# Patient Record
Sex: Female | Born: 1945 | Race: White | Hispanic: No | Smoking: Never smoker
Health system: Southern US, Community
[De-identification: ages and names within clinical notes are randomized; demographics above are authoritative.]

## PROBLEM LIST (undated history)

## (undated) DIAGNOSIS — I10 Essential (primary) hypertension: Secondary | ICD-10-CM

## (undated) DIAGNOSIS — J309 Allergic rhinitis, unspecified: Secondary | ICD-10-CM

## (undated) DIAGNOSIS — E669 Obesity, unspecified: Secondary | ICD-10-CM

## (undated) HISTORY — PX: TUBAL LIGATION: SHX77

## (undated) HISTORY — PX: MOUTH SURGERY: SHX715

## (undated) HISTORY — DX: Allergic rhinitis, unspecified: J30.9

## (undated) HISTORY — PX: DILATION AND CURETTAGE OF UTERUS: SHX78

## (undated) HISTORY — DX: Obesity, unspecified: E66.9

---

## 1997-12-11 ENCOUNTER — Other Ambulatory Visit: Admission: RE | Admit: 1997-12-11 | Discharge: 1997-12-11 | Payer: Self-pay | Admitting: *Deleted

## 1998-06-18 ENCOUNTER — Encounter: Admission: RE | Admit: 1998-06-18 | Discharge: 1998-09-16 | Payer: Self-pay | Admitting: Family Medicine

## 1998-09-18 ENCOUNTER — Encounter: Admission: RE | Admit: 1998-09-18 | Discharge: 1998-12-17 | Payer: Self-pay | Admitting: Family Medicine

## 2000-01-06 ENCOUNTER — Other Ambulatory Visit: Admission: RE | Admit: 2000-01-06 | Discharge: 2000-01-06 | Payer: Self-pay | Admitting: Family Medicine

## 2001-02-13 ENCOUNTER — Encounter: Payer: Self-pay | Admitting: Vascular Surgery

## 2001-02-13 ENCOUNTER — Ambulatory Visit (HOSPITAL_COMMUNITY): Admission: RE | Admit: 2001-02-13 | Discharge: 2001-02-13 | Payer: Self-pay | Admitting: Vascular Surgery

## 2001-02-13 ENCOUNTER — Encounter (INDEPENDENT_AMBULATORY_CARE_PROVIDER_SITE_OTHER): Payer: Self-pay | Admitting: Specialist

## 2002-06-12 ENCOUNTER — Emergency Department (HOSPITAL_COMMUNITY): Admission: EM | Admit: 2002-06-12 | Discharge: 2002-06-12 | Payer: Self-pay | Admitting: *Deleted

## 2003-02-05 ENCOUNTER — Other Ambulatory Visit: Admission: RE | Admit: 2003-02-05 | Discharge: 2003-02-05 | Payer: Self-pay | Admitting: Family Medicine

## 2004-12-15 ENCOUNTER — Other Ambulatory Visit: Admission: RE | Admit: 2004-12-15 | Discharge: 2004-12-15 | Payer: Self-pay | Admitting: *Deleted

## 2006-04-29 ENCOUNTER — Emergency Department (HOSPITAL_COMMUNITY): Admission: EM | Admit: 2006-04-29 | Discharge: 2006-04-29 | Payer: Self-pay | Admitting: Emergency Medicine

## 2007-01-20 ENCOUNTER — Emergency Department (HOSPITAL_COMMUNITY): Admission: EM | Admit: 2007-01-20 | Discharge: 2007-01-20 | Payer: Self-pay | Admitting: Emergency Medicine

## 2007-10-31 ENCOUNTER — Inpatient Hospital Stay (HOSPITAL_COMMUNITY): Admission: EM | Admit: 2007-10-31 | Discharge: 2007-11-01 | Payer: Self-pay | Admitting: Emergency Medicine

## 2008-01-04 ENCOUNTER — Ambulatory Visit (HOSPITAL_COMMUNITY): Admission: RE | Admit: 2008-01-04 | Discharge: 2008-01-04 | Payer: Self-pay | Admitting: Family Medicine

## 2008-02-20 ENCOUNTER — Encounter: Admission: RE | Admit: 2008-02-20 | Discharge: 2008-02-20 | Payer: Self-pay | Admitting: Family Medicine

## 2008-03-04 ENCOUNTER — Ambulatory Visit (HOSPITAL_COMMUNITY): Admission: RE | Admit: 2008-03-04 | Discharge: 2008-03-04 | Payer: Self-pay | Admitting: Family Medicine

## 2008-05-01 ENCOUNTER — Encounter (HOSPITAL_COMMUNITY): Admission: RE | Admit: 2008-05-01 | Discharge: 2008-07-30 | Payer: Self-pay | Admitting: Family Medicine

## 2010-03-15 ENCOUNTER — Encounter: Payer: Self-pay | Admitting: Family Medicine

## 2010-07-07 NOTE — Discharge Summary (Signed)
NAMEAYN, DOMANGUE               ACCOUNT NO.:  [id number]   MEDICAL RECORD NO.:  [id number]          PATIENT TYPE:  INP   LOCATION:  3703                         FACILITY:  [facility]   PHYSICIAN:  Francisca December, M.D.  DATE OF BIRTH:  11-20-1945   DATE OF ADMISSION:  10/31/2007  DATE OF DISCHARGE:  11/01/2007                               DISCHARGE SUMMARY   DISCHARGE DIAGNOSES:  1. Chest pain, atypical.  2. Bronchitis.  3. Cough.  4. Herpes simplex virus.  5. Dyslipidemia.  6. Postmenopausal.  7. Status post tubal ligation.  8. Long-term medication use.   HOSPITAL COURSE:  Ms. Ulibarri is a 65 year old female with no known  coronary artery disease who began having substernal chest pain around  8:20 on the morning of admission.  She describes this as 2 types of  pain, first of all as a chest wall sharp pain and then another type of  pain, which she describes as a mid-to-right chest heaviness.  This is  associated with dyspnea, dizziness, and productive cough with yellow  sputum.  She says it is painful to deep breath.  She does have a  history of bronchitis with pleurisy.   Her EKG was non-acute.  There was a slight ST segment elevation in the  lateral leads which was not new.  Her lab studies show negative cardiac  active enzymes x3.  White count 4.8, hemoglobin 12.0, hematocrit 34.8,  and platelets 202.  Total cholesterol 136, triglycerides 110, HDL 43,  and LDL 71.  D-dimer is 0.43.  Sodium 140, potassium 3.7, BUN 6, and  creatinine 0.69.  LFTs normal.  Final chest x-ray on November 01, 2007,  showed no acute cardiopulmonary abnormalities.   Because of her symptoms, we thought it was prudent for her to be placed  on the antibiotic for bronchitis as listed below.  She is to follow up  with Dr. Amil Amen only as needed.  She is instructed to follow with Dr.  Abigail Miyamoto if she is not better next week.   She is to resume Zocor daily.  She states she is on an osteopenia drug  every  Sunday, she is to resume this.  Vitamins daily as before, Valtrex  as needed, Zithromax pack to take as directed, and Tessalon Perles as  needed for cough.   She is to remain on a low-sodium, heart-healthy diet; increase activity  slowly; and again call Dr. Abigail Miyamoto and to see him next week if she is  not any better.      Guy Franco, P.A.      Francisca December, M.D.  Electronically Signed    LB/MEDQ  D:  11/01/2007  T:  11/01/2007  Job:  [id number]   cc:   Chales Salmon. Abigail Miyamoto, M.D.

## 2010-07-07 NOTE — H&P (Signed)
NAMEJERI, Jennifer Curry NO.:  [id number]   MEDICAL RECORD NO.:  [id number]          PATIENT TYPE:  INP   LOCATION:  3703                         FACILITY:  [facility]   PHYSICIAN:  Jennifer Curry, M.D.  DATE OF BIRTH:  04-Jan-1946   DATE OF ADMISSION:  10/31/2007  DATE OF DISCHARGE:                              HISTORY & PHYSICAL   CHIEF COMPLAINT:  Chest pain.   Jennifer Curry is a 65 year old female with no known history of coronary  artery disease who began having substernal chest pain acutely at 8:20  this morning.  She describes it as 2 types of pain.  First of all, she  describes it as a left sharp chest wall pain in the mid-to-right chest  area.  She describes it more as a heaviness.  This is associated with  dizziness and mild dyspnea.  She also has a productive cough with yellow  sputum.  She says it is painful when she takes a deep breath.  She has a  history of bronchitis.   PAST MEDICAL HISTORY:  1. Herpes simplex virus.  2. Dyslipidemia.  3. She is postmenopausal.  She has undergone tubal ligation.   SOCIAL HISTORY:  She lives in [place] with her boyfriend, but lives part-  time in [place] where her home is.  She denies any tobacco, alcohol, or  illicit drug use.   FAMILY HISTORY:  Mom died at age 57 of old age and probable heart  attack.  Dad died at age 12 of a railroad accident.  He was a Building surveyor.   REVIEW OF SYSTEMS:  As above, otherwise, negative.   ALLERGIES:  SULFA and VICODIN.   MEDICATIONS:  Zocor daily, osteopenia drug every Sunday, vitamins daily,  Valtrex p.r.n.   PHYSICAL EXAMINATION:  VITAL SIGNS:  Pulse 75, respirations 20, blood  pressure 133/76, O2 saturation is 99% on 2 L.  GENERAL:  She appears pale and generally ill.  HEENT:  Grossly normal.  No carotid or subclavian bruits.  No JVD or  thyromegaly.  Sclerae clear.  Conjunctivae normal.  Nares without  drainage.  CHEST:  Clear to auscultation bilaterally.  No  wheezing or rhonchi.  HEART:  Regular rate and rhythm.  There is murmur.  ABDOMEN:  Good bowel sounds, nontender, nondistended.  No masses.  No  bruits.  LOWER EXTREMITIES:  No peripheral edema.  SKIN:  Warm and dry.  NEUROLOGICAL:  Cranial nerves II through XII are grossly intact.  She  does appear anxious.   Chest x-ray and lab studies are pending at this time and will be  reviewed once they have been drawn.   EKG shows normal sinus rhythm with nonspecific ST or T-wave changes  inferolaterally, seems to be unchanged from previous EKG from Dr.  Recardo Evangelist office.   ASSESSMENT AND PLAN:  1. Chest pain, atypical.  2. Dyslipidemia.  3. Osteopenia.  4. Herpes simplex virus.  5. Postmenopausal.  6. Cough.   We will admit her, check enzymes and electrolytes.  Chest x-ray do show  no underlying pneumonia.  If enzymes are positive,  we will go ahead on  cath.  If negative, she will need a Cardiolite at some point.   The patient has been seen and examined by Dr. Corliss Curry.      Jennifer Curry, P.A.      Jennifer Curry, M.D.  Electronically Signed    LB/MEDQ  D:  10/31/2007  T:  11/01/2007  Job:  [id number]   cc:   Jennifer Curry. Jennifer Curry, M.D.  Jennifer Curry, M.D.

## 2010-07-10 NOTE — Op Note (Signed)
[place]  Patient:    BILLIJO, DILLING Visit Number: [id number] MRN: [id number]          Service Type: Attending:  Quita Skye. Hart Rochester, M.D. Dictated by:   Quita Skye Hart Rochester, M.D. Proc. Date: 02/13/01                             Operative Report  PREOPERATIVE DIAGNOSIS:  Rule out temporal arteritis, left temporal headaches.  POSTOPERATIVE DIAGNOSIS:  Rule out temporal arteritis, left temporal headaches.  OPERATION:  Left temporal artery biopsy.  SURGEON:  Quita Skye. Hart Rochester, M.D.  FIRST ASSISTANT:  Nurse.  ANESTHESIA:  Local.  PROCEDURE:  The patient was taken to the operating room and placed in the supine position, at which time the left temporal area was prepped with Betadine solution and draped in a routine sterile manner.  After infiltration with 1% Xylocaine, a short longitudinal incision was made anterior to the tragus of the ear over the lying superficial temporal artery.  This was carried down through subcutaneous tissue and the superficial temporal artery was identified, dissected free, and ligated proximally and distally with 3-0 silk ties.  A 1.5 cm segment was removed and sent to the lab for pathologic evaluation.  Adequate hemostasis was achieved.  The wound was closed with Vicryl in a subcuticular fashion with a Steri-Strip, sterile dressing applied. The patient was taken to the recovery room in satisfactory condition. Dictated by:   Quita Skye Hart Rochester, M.D. Attending:  Quita Skye. Hart Rochester, M.D. DD:  02/13/01 TD:  02/13/01 Job: [id number]

## 2010-11-24 LAB — BUN: BUN: 10

## 2010-11-24 LAB — CREATININE, SERUM: GFR calc Af Amer: >60

## 2010-11-25 LAB — COMPREHENSIVE METABOLIC PANEL
ALT: 16
BUN: 6
CO2: 26
Calcium: 8.7
Creatinine, Ser: 0.69
GFR calc non Af Amer: >60
Glucose, Bld: 88

## 2010-11-25 LAB — DIFFERENTIAL
Eosinophils Absolute: 0.1
Lymphs Abs: 1.2
Neutrophils Relative %: 72

## 2010-11-25 LAB — PROTIME-INR
INR: 1.1
Prothrombin Time: 14.6

## 2010-11-25 LAB — CBC
HCT: 34.8 — ABNORMAL LOW
Hemoglobin: 12
MCHC: 34.2
MCV: 92.1
MCV: 92.6
Platelets: 199
RBC: 3.78 — ABNORMAL LOW
RDW: 13
WBC: 4.8

## 2010-11-25 LAB — CARDIAC PANEL(CRET KIN+CKTOT+MB+TROPI)
Relative Index: INVALID
Troponin I: 0.01
Troponin I: 0.01

## 2010-11-25 LAB — LIPID PANEL
Cholesterol: 136
Total CHOL/HDL Ratio: 3.2
VLDL: 22

## 2010-11-25 LAB — CK TOTAL AND CKMB (NOT AT ARMC): Relative Index: INVALID

## 2011-10-15 DIAGNOSIS — N760 Acute vaginitis: Secondary | ICD-10-CM | POA: Diagnosis not present

## 2012-06-08 DIAGNOSIS — Z1231 Encounter for screening mammogram for malignant neoplasm of breast: Secondary | ICD-10-CM | POA: Diagnosis not present

## 2013-01-26 ENCOUNTER — Emergency Department (HOSPITAL_COMMUNITY)
Admission: EM | Admit: 2013-01-26 | Discharge: 2013-01-26 | Disposition: A | Discharge status: Home / Self Care | Payer: Medicare Other | Attending: Emergency Medicine | Admitting: Emergency Medicine

## 2013-01-26 ENCOUNTER — Emergency Department (HOSPITAL_COMMUNITY): Payer: Medicare Other

## 2013-01-26 ENCOUNTER — Encounter (HOSPITAL_COMMUNITY): Payer: Self-pay | Admitting: Emergency Medicine

## 2013-01-26 DIAGNOSIS — S0993XA Unspecified injury of face, initial encounter: Secondary | ICD-10-CM | POA: Diagnosis not present

## 2013-01-26 DIAGNOSIS — S0120XA Unspecified open wound of nose, initial encounter: Secondary | ICD-10-CM | POA: Insufficient documentation

## 2013-01-26 DIAGNOSIS — S0990XA Unspecified injury of head, initial encounter: Secondary | ICD-10-CM | POA: Insufficient documentation

## 2013-01-26 DIAGNOSIS — M25511 Pain in right shoulder: Secondary | ICD-10-CM

## 2013-01-26 DIAGNOSIS — S8002XA Contusion of left knee, initial encounter: Secondary | ICD-10-CM

## 2013-01-26 DIAGNOSIS — S0003XA Contusion of scalp, initial encounter: Secondary | ICD-10-CM | POA: Insufficient documentation

## 2013-01-26 DIAGNOSIS — IMO0002 Reserved for concepts with insufficient information to code with codable children: Secondary | ICD-10-CM | POA: Insufficient documentation

## 2013-01-26 DIAGNOSIS — Z23 Encounter for immunization: Secondary | ICD-10-CM | POA: Insufficient documentation

## 2013-01-26 DIAGNOSIS — W1809XA Striking against other object with subsequent fall, initial encounter: Secondary | ICD-10-CM | POA: Insufficient documentation

## 2013-01-26 DIAGNOSIS — S8001XA Contusion of right knee, initial encounter: Secondary | ICD-10-CM

## 2013-01-26 DIAGNOSIS — Y9301 Activity, walking, marching and hiking: Secondary | ICD-10-CM | POA: Insufficient documentation

## 2013-01-26 DIAGNOSIS — Y9289 Other specified places as the place of occurrence of the external cause: Secondary | ICD-10-CM | POA: Insufficient documentation

## 2013-01-26 DIAGNOSIS — S199XXA Unspecified injury of neck, initial encounter: Secondary | ICD-10-CM | POA: Diagnosis not present

## 2013-01-26 DIAGNOSIS — S8000XA Contusion of unspecified knee, initial encounter: Secondary | ICD-10-CM | POA: Insufficient documentation

## 2013-01-26 DIAGNOSIS — W19XXXA Unspecified fall, initial encounter: Secondary | ICD-10-CM

## 2013-01-26 DIAGNOSIS — S4980XA Other specified injuries of shoulder and upper arm, unspecified arm, initial encounter: Secondary | ICD-10-CM | POA: Diagnosis not present

## 2013-01-26 DIAGNOSIS — S46909A Unspecified injury of unspecified muscle, fascia and tendon at shoulder and upper arm level, unspecified arm, initial encounter: Secondary | ICD-10-CM | POA: Insufficient documentation

## 2013-01-26 DIAGNOSIS — T148XXA Other injury of unspecified body region, initial encounter: Secondary | ICD-10-CM

## 2013-01-26 LAB — CBC
HCT: 37.9 % (ref 36.0–46.0)
Hemoglobin: 13 g/dL (ref 12.0–15.0)
MCH: 31 pg (ref 26.0–34.0)
MCHC: 34.3 g/dL (ref 30.0–36.0)

## 2013-01-26 LAB — URINALYSIS, ROUTINE W REFLEX MICROSCOPIC
Bilirubin Urine: NEGATIVE
Glucose, UA: NEGATIVE mg/dL
Hgb urine dipstick: NEGATIVE
Ketones, ur: NEGATIVE mg/dL
pH: 7 (ref 5.0–8.0)

## 2013-01-26 LAB — COMPREHENSIVE METABOLIC PANEL
Alkaline Phosphatase: 84 U/L (ref 39–117)
BUN: 13 mg/dL (ref 6–23)
CO2: 27 mEq/L (ref 19–32)
Calcium: 9.2 mg/dL (ref 8.4–10.5)
GFR calc Af Amer: >90 mL/min (ref >90)
GFR calc non Af Amer: 85 mL/min — ABNORMAL LOW (ref >90)
Glucose, Bld: 123 mg/dL — ABNORMAL HIGH (ref 70–99)
Total Protein: 7.7 g/dL (ref 6.0–8.3)

## 2013-01-26 LAB — URINE MICROSCOPIC-ADD ON

## 2013-01-26 MED ORDER — LIDOCAINE-EPINEPHRINE (PF) 2 %-1:200000 IJ SOLN: 10.0000 mL | Freq: Once | INTRAMUSCULAR | Status: DC

## 2013-01-26 MED ORDER — ONDANSETRON HCL 4 MG/2ML IJ SOLN
4.0000 mg | Freq: Once | INTRAMUSCULAR | Status: AC
  Administered 2013-01-26: 4 mg via INTRAVENOUS
  Filled 2013-01-26: qty 2

## 2013-01-26 MED ORDER — TETANUS-DIPHTH-ACELL PERTUSSIS 5-2.5-18.5 LF-MCG/0.5 IM SUSP
0.5000 mL | Freq: Once | INTRAMUSCULAR | Status: AC
  Administered 2013-01-26: 0.5 mL via INTRAMUSCULAR
  Filled 2013-01-26: qty 0.5

## 2013-01-26 MED ORDER — LIDOCAINE HCL 2 % IJ SOLN
INTRAMUSCULAR | Status: AC
  Administered 2013-01-26: 20:00:00
  Filled 2013-01-26: qty 20

## 2013-01-26 MED ORDER — IBUPROFEN 800 MG PO TABS
800.0000 mg | ORAL_TABLET | Freq: Three times a day (TID) | ORAL | Status: DC
Start: 2013-01-26 — End: 2014-11-14

## 2013-01-26 MED ORDER — LIDOCAINE HCL 2 % IJ SOLN
10.0000 mL | Freq: Once | INTRAMUSCULAR | Status: AC
  Administered 2013-01-26: 200 mg

## 2013-01-26 MED ORDER — HYDROCODONE-ACETAMINOPHEN 5-325 MG PO TABS: 2.0000 | ORAL_TABLET | ORAL | Status: DC | PRN

## 2013-01-26 MED ORDER — MORPHINE SULFATE 2 MG/ML IJ SOLN
2.0000 mg | Freq: Once | INTRAMUSCULAR | Status: DC
  Filled 2013-01-26: qty 1

## 2013-01-26 NOTE — ED Provider Notes (Signed)
CSN: [id number]     Arrival date & time 01/26/13  1621 History   First MD Initiated Contact with Patient 01/26/13 1627     Chief Complaint  Patient presents with  . Fall  . Facial Injury   (Consider location/radiation/quality/duration/timing/severity/associated sxs/prior Treatment) HPI Comments: The patient is a 67 year-old female  presenting the Emergency Department with a chief complaint of fall.  The patient reports walking in a parking lot and falling onto the pavement.  The pateitn reports hitting her face on asphalt without LOC and was able to partially brace her fall with her hands. She reports that she had pain in her nose, shoulders, and knees. She was able to ambulate without difficulty at the scene. While en route to the ED she reports nausea.  She denies lightheadedness, dizziness, palpitations, or weakness preceding the fall. She denies new medication.  No fever or chills, abdominal pain, diarrhea, constipation or vomiting.  She reports a frontal headache that is worsening since the fall.   The history is provided by the patient. No language interpreter was used.    History reviewed. No pertinent past medical history. Past Surgical History  Procedure Laterality Date  . Dilation and curettage of uterus    . Mouth surgery     No family history on file. History  Substance Use Topics  . Smoking status: Never Smoker   . Smokeless tobacco: Not on file  . Alcohol Use: No   OB History   Grav Para Term Preterm Abortions TAB SAB Ect Mult Living                 Review of Systems  Allergies  Shellfish allergy; Sulfa antibiotics; Oxycodone; and Vicodin  Home Medications   Current Outpatient Rx  Name  Route  Sig  Dispense  Refill  . acetaminophen (TYLENOL) 325 MG tablet   Oral   Take 650 mg by mouth every 6 (six) hours as needed (pain).         Marland Kitchen ibuprofen (ADVIL,MOTRIN) 800 MG tablet   Oral   Take 1 tablet (800 mg total) by mouth 3 (three) times daily.   21 tablet   0    BP 143/83  Pulse 75  Temp(Src) 97.8 F (36.6 C) (Oral)  Resp 16  SpO2 96% Physical Exam  Nursing note and vitals reviewed. Constitutional: She is oriented to person, place, and time. She appears well-developed and well-nourished.  HENT:  Head: Normocephalic. Head is without raccoon's eyes and without Battle's sign.  Right Ear: Tympanic membrane and external ear normal.  Left Ear: Tympanic membrane and external ear normal.  Nose: Nose lacerations and sinus tenderness present. No nasal deformity or septal deviation. No epistaxis. Right sinus exhibits frontal sinus tenderness. Right sinus exhibits no maxillary sinus tenderness. Left sinus exhibits frontal sinus tenderness. Left sinus exhibits no maxillary sinus tenderness.    Mouth/Throat: Uvula is midline. Normal dentition. No lacerations.  Dried blood from laceration. Mild swelling L>R to the bridge of the nose. No dried blood in nares.  No obvious deformity of nose. No malocclusion.  No dental contusion, fracture, or avulsion.   Eyes: EOM are normal. Pupils are equal, round, and reactive to light.  Neck: Normal range of motion. Neck supple.  Cardiovascular: Normal rate and regular rhythm.   No murmur heard. Pulmonary/Chest: Effort normal.  Abdominal: Soft. There is no tenderness. There is no rebound and no guarding.  Musculoskeletal:       Right shoulder: She exhibits decreased  range of motion, tenderness and bony tenderness. She exhibits no swelling, no crepitus, no deformity, no laceration, normal pulse and normal strength.       Right hip: She exhibits no tenderness and no deformity.       Left hip: She exhibits no tenderness and no deformity.       Right knee: She exhibits erythema. She exhibits normal range of motion, no ecchymosis and no laceration.       Left knee: She exhibits swelling and ecchymosis. She exhibits normal range of motion, no effusion, no deformity, no laceration and normal alignment.  No obvious  deformity, crepitus, or step-off noted. Right should decrease ROM secondary to pain, questionable effort. No anatomical snuffbox tenderness with palpation.  Abrasions noted to bilateral wrists, both non-tender.  Minimal eccymosis to left knee.    Neurological: She is alert and oriented to person, place, and time. She is not disoriented. No cranial nerve deficit or sensory deficit.  Skin: Skin is warm.    ED Course  LACERATION REPAIR Performed by: Clabe Seal Authorized by: Clabe Seal Consent: Verbal consent obtained. Risks and benefits: risks, benefits and alternatives were discussed Consent given by: patient Patient understanding: patient states understanding of the procedure being performed Patient consent: the patient's understanding of the procedure matches consent given Imaging studies: imaging studies available Required items: required blood products, implants, devices, and special equipment available Patient identity confirmed: verbally with patient Time out: Immediately prior to procedure a "time out" was called to verify the correct patient, procedure, equipment, support staff and site/side marked as required. Body area: head/neck Location details: nose Laceration length: 0.5 cm Contamination: The wound is contaminated. Foreign body present: ashpalt  Anesthesia: local infiltration Local anesthetic: lidocaine 2% without epinephrine Anesthetic total: 3.5 ml Preparation: Patient was prepped and draped in the usual sterile fashion. Irrigation solution: saline Irrigation method: syringe Amount of cleaning: standard Debridement: moderate Skin closure: Steri-Strips Dressing: 4x4 sterile gauze Patient tolerance: Patient tolerated the procedure well with no immediate complications.   (including critical care time) Labs Review Labs Reviewed  URINALYSIS, ROUTINE W REFLEX MICROSCOPIC - Abnormal; Notable for the following:    Leukocytes, UA TRACE (*)    All other  components within normal limits  COMPREHENSIVE METABOLIC PANEL - Abnormal; Notable for the following:    Potassium 3.4 (*)    Glucose, Bld 123 (*)    GFR calc non Af Amer 85 (*)    All other components within normal limits  URINE MICROSCOPIC-ADD ON - Abnormal; Notable for the following:    Bacteria, UA FEW (*)    All other components within normal limits  URINE CULTURE  CBC   Imaging Review Dg Shoulder Right  01/26/2013   CLINICAL DATA:  Fall, right shoulder pain  EXAM: RIGHT SHOULDER - 2+ VIEW  COMPARISON:  None.  FINDINGS: Normal alignment without fracture. Healed right mid clavicle fracture evident. No definite soft tissue abnormality.  IMPRESSION: No acute osseous finding.   Electronically Signed   By: Ruel Favors M.D.   On: 01/26/2013 17:30   Ct Head Wo Contrast  01/26/2013   CLINICAL DATA:  Facial injury following a fall.  EXAM: CT HEAD WITHOUT CONTRAST  CT MAXILLOFACIAL WITHOUT CONTRAST  CT CERVICAL SPINE WITHOUT CONTRAST  TECHNIQUE: Multidetector CT imaging of the head, cervical spine, and maxillofacial structures were performed using the standard protocol without intravenous contrast. Multiplanar CT image reconstructions of the cervical spine and maxillofacial structures were also generated.  COMPARISON:  Head CT report dated 02/21/2003 and cervical spine radiographs report dated 02/21/2003.  FINDINGS: CT HEAD FINDINGS  Normal appearing cerebral hemispheres and posterior fossa structures. Normal size and position of the ventricles. No skull fracture, intracranial hemorrhage or paranasal sinus air-fluid levels.  CT MAXILLOFACIAL FINDINGS  Fracture of the tip of the anterior maxillary spine. The nasal bone is intact. No other fractures are seen. No paranasal sinus air-fluid levels. Right maxillary sinus retention cysts.  CT CERVICAL SPINE FINDINGS  Reversal of the normal cervical lordosis. Degenerative changes at multiple levels. No prevertebral soft tissue swelling, fractures or  subluxations.  IMPRESSION: 1. No skull fracture or intracranial hemorrhage. 2. Fracture of the tip of the anterior maxillary spine. 3. The no cervical spine fracture or subluxation. 4. Reversal of the normal cervical lordosis and multilevel cervical spine degenerative changes.   Electronically Signed   By: Gordan Payment M.D.   On: 01/26/2013 19:05   Ct Cervical Spine Wo Contrast  01/26/2013   CLINICAL DATA:  Facial injury following a fall.  EXAM: CT HEAD WITHOUT CONTRAST  CT MAXILLOFACIAL WITHOUT CONTRAST  CT CERVICAL SPINE WITHOUT CONTRAST  TECHNIQUE: Multidetector CT imaging of the head, cervical spine, and maxillofacial structures were performed using the standard protocol without intravenous contrast. Multiplanar CT image reconstructions of the cervical spine and maxillofacial structures were also generated.  COMPARISON:  Head CT report dated 02/21/2003 and cervical spine radiographs report dated 02/21/2003.  FINDINGS: CT HEAD FINDINGS  Normal appearing cerebral hemispheres and posterior fossa structures. Normal size and position of the ventricles. No skull fracture, intracranial hemorrhage or paranasal sinus air-fluid levels.  CT MAXILLOFACIAL FINDINGS  Fracture of the tip of the anterior maxillary spine. The nasal bone is intact. No other fractures are seen. No paranasal sinus air-fluid levels. Right maxillary sinus retention cysts.  CT CERVICAL SPINE FINDINGS  Reversal of the normal cervical lordosis. Degenerative changes at multiple levels. No prevertebral soft tissue swelling, fractures or subluxations.  IMPRESSION: 1. No skull fracture or intracranial hemorrhage. 2. Fracture of the tip of the anterior maxillary spine. 3. The no cervical spine fracture or subluxation. 4. Reversal of the normal cervical lordosis and multilevel cervical spine degenerative changes.   Electronically Signed   By: Gordan Payment M.D.   On: 01/26/2013 19:05   Ct Maxillofacial Wo Cm  01/26/2013   CLINICAL DATA:  Facial injury  following a fall.  EXAM: CT HEAD WITHOUT CONTRAST  CT MAXILLOFACIAL WITHOUT CONTRAST  CT CERVICAL SPINE WITHOUT CONTRAST  TECHNIQUE: Multidetector CT imaging of the head, cervical spine, and maxillofacial structures were performed using the standard protocol without intravenous contrast. Multiplanar CT image reconstructions of the cervical spine and maxillofacial structures were also generated.  COMPARISON:  Head CT report dated 02/21/2003 and cervical spine radiographs report dated 02/21/2003.  FINDINGS: CT HEAD FINDINGS  Normal appearing cerebral hemispheres and posterior fossa structures. Normal size and position of the ventricles. No skull fracture, intracranial hemorrhage or paranasal sinus air-fluid levels.  CT MAXILLOFACIAL FINDINGS  Fracture of the tip of the anterior maxillary spine. The nasal bone is intact. No other fractures are seen. No paranasal sinus air-fluid levels. Right maxillary sinus retention cysts.  CT CERVICAL SPINE FINDINGS  Reversal of the normal cervical lordosis. Degenerative changes at multiple levels. No prevertebral soft tissue swelling, fractures or subluxations.  IMPRESSION: 1. No skull fracture or intracranial hemorrhage. 2. Fracture of the tip of the anterior maxillary spine. 3. The no cervical spine fracture or subluxation. 4. Reversal of the  normal cervical lordosis and multilevel cervical spine degenerative changes.   Electronically Signed   By: Gordan Payment M.D.   On: 01/26/2013 19:05    EKG Interpretation    Date/Time:  Friday January 26 2013 17:50:26 EST Ventricular Rate:  79 PR Interval:  162 QRS Duration: 97 QT Interval:  384 QTC Calculation: 440 R Axis:   44 Text Interpretation:  Sinus rhythm No significant change since last tracing Confirmed by STEINL  MD, KEVIN ([id number]) on 01/26/2013 5:55:50 PM            MDM   1. Fall from standing, initial encounter   2. Contusion of nose   3. Shoulder pain, acute, right   4. Contusion of left knee, initial  encounter   5. Contusion of knee, right, initial encounter    Pt with a history of a fall.  Laceration, abrasions, and contusion on exam.  No obvious fracture.  Discussed patient history and condition with Dr. Denton Lank, who agrees on current labs and imaging ordered.  Work-up unrevealing.  Laceration repair performed. Discussed lab results, imaging results, and treatment plan with the patient.  She reports understanding and no other concerns at this time.   Patient is stable for discharge at this time.  Meds given in ED:  Medications  ondansetron (ZOFRAN) injection 4 mg (4 mg Intravenous Given 01/26/13 1730)  Tdap (BOOSTRIX) injection 0.5 mL (0.5 mLs Intramuscular Given 01/26/13 1859)  lidocaine (XYLOCAINE) 2 % (with pres) injection (  Given by Other 01/26/13 2007)  lidocaine (XYLOCAINE) 2 % (with pres) injection 200 mg (200 mg Infiltration Given by Other 01/26/13 2007)    Discharge Medication List as of 01/26/2013  8:50 PM    START taking these medications   Details  ibuprofen (ADVIL,MOTRIN) 800 MG tablet Take 1 tablet (800 mg total) by mouth 3 (three) times daily., Starting 01/26/2013, Until Discontinued, Print          Clabe Seal, PA-C 01/28/13 1500

## 2013-01-26 NOTE — ED Notes (Signed)
Per pt, she states that she tripped in a parking lot, hitting her face on pavement. Pt had no LOC, but c/o dizziness. Pt is A&O and in NAD

## 2013-01-26 NOTE — ED Notes (Signed)
Pt in via EMS after fall in parking lot. Pt hit face on concrete, bleeding controlled. Pt denies neck, back pain, but reports R shoulder and facial pain from injury. Pt had no LOC, bleeding controlled

## 2013-01-26 NOTE — ED Notes (Signed)
Lauren PA at bedside suturing pt's nose

## 2013-01-26 NOTE — ED Notes (Signed)
Bed: WA08 Expected date:  Expected time:  Means of arrival:  Comments: EMS-fall 

## 2013-01-28 LAB — URINE CULTURE

## 2013-01-28 NOTE — ED Provider Notes (Signed)
Medical screening examination/treatment/procedure(s) were conducted as a shared visit with non-physician practitioner(s) and myself.  I personally evaluated the patient during the encounter.  EKG Interpretation    Date/Time:  Friday January 26 2013 17:50:26 EST Ventricular Rate:  79 PR Interval:  162 QRS Duration: 97 QT Interval:  384 QTC Calculation: 440 R Axis:   44 Text Interpretation:  Sinus rhythm No significant change since last tracing Confirmed by Shastina Rua  MD, Jerney Baksh ([id number]) on 01/26/2013 5:55:50 PM            Pt s/p fall, fell forward, hitting face. Marked swelling/contusion to nose and mid face. Mid cervical tenderness. C/o headache. Labs. Cts.   Suzi Roots, MD 01/28/13 [phone]

## 2013-04-27 DIAGNOSIS — D179 Benign lipomatous neoplasm, unspecified: Secondary | ICD-10-CM | POA: Diagnosis not present

## 2013-05-07 ENCOUNTER — Ambulatory Visit (INDEPENDENT_AMBULATORY_CARE_PROVIDER_SITE_OTHER): Payer: Medicare Other | Admitting: Surgery

## 2013-05-07 ENCOUNTER — Encounter (INDEPENDENT_AMBULATORY_CARE_PROVIDER_SITE_OTHER): Payer: Self-pay | Admitting: Surgery

## 2013-05-07 ENCOUNTER — Encounter (INDEPENDENT_AMBULATORY_CARE_PROVIDER_SITE_OTHER): Payer: Self-pay

## 2013-05-07 VITALS — BP 134/74 | HR 75 | Temp 97.8°F | Ht 64.0 in | Wt 211.0 lb

## 2013-05-07 DIAGNOSIS — R229 Localized swelling, mass and lump, unspecified: Secondary | ICD-10-CM | POA: Diagnosis not present

## 2013-05-07 DIAGNOSIS — E669 Obesity, unspecified: Secondary | ICD-10-CM | POA: Insufficient documentation

## 2013-05-07 DIAGNOSIS — R222 Localized swelling, mass and lump, trunk: Secondary | ICD-10-CM

## 2013-05-07 DIAGNOSIS — L720 Epidermal cyst: Secondary | ICD-10-CM | POA: Insufficient documentation

## 2013-05-07 HISTORY — DX: Obesity, unspecified: E66.9

## 2013-05-07 NOTE — Patient Instructions (Signed)
Please consider the recommendations that we have given you today:  I suspect the mass above your left shoulder blade is a sebaceous cyst.  Consider removal.  It is reasonable to remove it in the office after using a local anesthetic "novocaine" like block.  See the Handout(s) we have given you.  Please call our office at [phone] if you wish to schedule surgery or if you have further questions / concerns.   Epidermal Cyst An epidermal cyst is sometimes called a sebaceous cyst, epidermal inclusion cyst, or infundibular cyst. These cysts usually contain a substance that looks "pasty" or "cheesy" and may have a bad smell. This substance is a protein called keratin. Epidermal cysts are usually found on the face, neck, or trunk. They may also occur in the vaginal area or other parts of the genitalia of both men and women. Epidermal cysts are usually small, painless, slow-growing bumps or lumps that move freely under the skin. It is important not to try to pop them. This may cause an infection and lead to tenderness and swelling. CAUSES  Epidermal cysts may be caused by a deep penetrating injury to the skin or a plugged hair follicle, often associated with acne. SYMPTOMS  Epidermal cysts can become inflamed and cause:  Redness.  Tenderness.  Increased temperature of the skin over the bumps or lumps.  Grayish-white, bad smelling material that drains from the bump or lump. DIAGNOSIS  Epidermal cysts are easily diagnosed by your caregiver during an exam. Rarely, a tissue sample (biopsy) may be taken to rule out other conditions that may resemble epidermal cysts. TREATMENT   Epidermal cysts often get better and disappear on their own. They are rarely ever cancerous.  If a cyst becomes infected, it may become inflamed and tender. This may require opening and draining the cyst. Treatment with antibiotics may be necessary. When the infection is gone, the cyst may be removed with minor  surgery.  Small, inflamed cysts can often be treated with antibiotics or by injecting steroid medicines.  Sometimes, epidermal cysts become large and bothersome. If this happens, surgical removal in your caregiver's office may be necessary. HOME CARE INSTRUCTIONS  Only take over-the-counter or prescription medicines as directed by your caregiver.  Take your antibiotics as directed. Finish them even if you start to feel better. SEEK MEDICAL CARE IF:   Your cyst becomes tender, red, or swollen.  Your condition is not improving or is getting worse.  You have any other questions or concerns. MAKE SURE YOU:  Understand these instructions.  Will watch your condition.  Will get help right away if you are not doing well or get worse. Document Released: 01/10/2004 Document Revised: 05/03/2011 Document Reviewed: 08/17/2010 [facility] Patient Information 2014 [place], [place].  GENERAL SURGERY: POST OP INSTRUCTIONS  1. DIET: Follow a light bland diet the first 24 hours after arrival home, such as soup, liquids, crackers, etc.  Be sure to include lots of fluids daily.  Avoid fast food or heavy meals as your are more likely to get nauseated.   2. Take your usually prescribed home medications unless otherwise directed. 3. PAIN CONTROL: a. Pain is best controlled by a usual combination of three different methods TOGETHER: i. Ice/Heat ii. Over the counter pain medication iii. Prescription pain medication b. Most patients will experience some swelling and bruising around the incisions.  Ice packs or heating pads (30-60 minutes up to 6 times a day) will help. Use ice for the first few days to help  decrease swelling and bruising, then switch to heat to help relax tight/sore spots and speed recovery.  Some people prefer to use ice alone, heat alone, alternating between ice & heat.  Experiment to what works for you.  Swelling and bruising can take several weeks to resolve.   c. It is helpful to take an  over-the-counter pain medication regularly for the first few weeks.  Choose one of the following that works best for you: i. Naproxen (Aleve, etc)  Two 220mg  tabs twice a day ii. Ibuprofen (Advil, etc) Three 200mg  tabs four times a day (every meal & bedtime) iii. Acetaminophen (Tylenol, etc) 500-650mg  four times a day (every meal & bedtime) d. A  prescription for pain medication (such as oxycodone, hydrocodone, etc) should be given to you upon discharge.  Take your pain medication as prescribed.  i. If you are having problems/concerns with the prescription medicine (does not control pain, nausea, vomiting, rash, itching, etc), please call us [phone] to see if we need to switch you to a different pain medicine that will work better for you and/or control your side effect better. ii. If you need a refill on your pain medication, please contact your pharmacy.  They will contact our office to request authorization. Prescriptions will not be filled after 5 pm or on week-ends. 4. Avoid getting constipated.  Between the surgery and the pain medications, it is common to experience some constipation.  Increasing fluid intake and taking a fiber supplement (such as Metamucil, Citrucel, FiberCon, MiraLax, etc) 1-2 times a day regularly will usually help prevent this problem from occurring.  A mild laxative (prune juice, Milk of Magnesia, MiraLax, etc) should be taken according to package directions if there are no bowel movements after 48 hours.   5. Wash / shower every day.  You may shower over the dressings as they are waterproof.  Continue to shower over incision(s) after the dressing is off. 6. Remove your waterproof bandages 5 days after surgery.  You may leave the incision open to air.  You may have skin tapes (Steri Strips) covering the incision(s).  Leave them on until one week, then remove.  You may replace a dressing/Band-Aid to cover the incision for comfort if you wish.      7. ACTIVITIES as  tolerated:   a. You may resume regular (light) daily activities beginning the next day-such as daily self-care, walking, climbing stairs-gradually increasing activities as tolerated.  If you can walk 30 minutes without difficulty, it is safe to try more intense activity such as jogging, treadmill, bicycling, low-impact aerobics, swimming, etc. b. Save the most intensive and strenuous activity for last such as sit-ups, heavy lifting, contact sports, etc  Refrain from any heavy lifting or straining until you are off narcotics for pain control.   c. DO NOT PUSH THROUGH PAIN.  Let pain be your guide: If it hurts to do something, don't do it.  Pain is your body warning you to avoid that activity for another week until the pain goes down. d. You may drive when you are no longer taking prescription pain medication, you can comfortably wear a seatbelt, and you can safely maneuver your car and apply brakes. e. Dennis Bast may have sexual intercourse when it is comfortable.  8. FOLLOW UP in our office a. Please call [facility] at [phone] to set up an appointment to see your surgeon in the office for a follow-up appointment approximately 2-3 weeks after your surgery. b. Make sure that  you call for this appointment the day you arrive home to insure a convenient appointment time. 9. IF YOU HAVE DISABILITY OR FAMILY LEAVE FORMS, BRING THEM TO THE OFFICE FOR PROCESSING.  DO NOT GIVE THEM TO YOUR DOCTOR.   WHEN TO CALL us [phone]: 1. Poor pain control 2. Reactions / problems with new medications (rash/itching, nausea, etc)  3. Fever over 101.5 F (38.5 C) 4. Worsening swelling or bruising 5. Continued bleeding from incision. 6. Increased pain, redness, or drainage from the incision 7. Difficulty breathing / swallowing   The clinic staff is available to answer your questions during regular business hours (8:30am-5pm).  Please don't hesitate to call and ask to speak to one of our nurses for clinical concerns.   If  you have a medical emergency, go to the nearest emergency room or call 911.  A surgeon from [facility] is always on call at the [facility], [place], [place], [place], [place]  [address] ? MAIN: [phone] ? TOLL FREE: [phone] ?  FAX [phone] [email or web address]

## 2013-05-07 NOTE — Progress Notes (Signed)
Subjective:     Patient ID: Jennifer Curry, female   DOB: 09-15-1945, 68 y.o.   MRN: [id number]  HPI  Note: This dictation was prepared with Dragon/digital dictation along with [facility] technology. Any transcriptional errors that result from this process are unintentional.       Jennifer Curry  1945/12/07 [id number]  Patient Care Team: Kathyrn Lass, MD as PCP - General (Family Medicine) Wenda Low, MD as Consulting Physician (Internal Medicine)   This patient is a 68 y.o.female who presents today for surgical evaluation at the request of Dr. Deforest Hoyles.   Reason for visit: Enlarging mass on back.  ?Lipoma  A pleasant woman that has noticed a lump on her back since ~ 2006.  It is gotten larger more recently.  She has had a couple episodes where her daughter could express "gunk".  Her daughter is recovering from endometrial cancer so she tended to avoid doctors, especially since her main 2 MDs retired.  Now that her daughter is in better shape, her daughter & significant other wished to return the favor and have the patient be seen for this mass since it has gotten larger.  It is sensitive but not severely painful.  No episodes of severe drainage or blood.  No history or fall or trauma there.  No fevers or chills.  She has gradually gained weight.  Energy level is otherwise been okay.  She comes today with her significant other.  Patient Active Problem List   Diagnosis Date Noted  . Mass on left upper back, 3cm SQ 05/07/2013  . Obesity (BMI 30-39.9) 05/07/2013    Past Medical History  Diagnosis Date  . Obesity (BMI 30-39.9) 05/07/2013    Past Surgical History  Procedure Laterality Date  . Dilation and curettage of uterus    . Mouth surgery      History   Social History  . Marital Status: Widowed    Spouse Name: N/A    Number of Children: N/A  . Years of Education: N/A   Occupational History  . Not on file.   Social History Main Topics  . Smoking status: Never  Smoker   . Smokeless tobacco: Not on file  . Alcohol Use: No  . Drug Use: No  . Sexual Activity: Not on file   Other Topics Concern  . Not on file   Social History Narrative  . No narrative on file    Family History  Problem Relation Age of Onset  . Heart disease Mother     Current Outpatient Prescriptions  Medication Sig Dispense Refill  . ibuprofen (ADVIL,MOTRIN) 800 MG tablet Take 1 tablet (800 mg total) by mouth 3 (three) times daily.  21 tablet  0  . acetaminophen (TYLENOL) 325 MG tablet Take 650 mg by mouth every 6 (six) hours as needed (pain).       No current facility-administered medications for this visit.     Allergies  Allergen Reactions  . Shellfish Allergy Anaphylaxis  . Sulfa Antibiotics Anaphylaxis  . Oxycodone Nausea And Vomiting  . Vicodin [Hydrocodone-Acetaminophen] Nausea And Vomiting    BP 134/74  Pulse 75  Temp(Src) 97.8 F (36.6 C) (Oral)  Ht 5\' 4"  (1.626 m)  Wt 211 lb (95.709 kg)  BMI 36.20 kg/m2  No results found.   Review of Systems  Constitutional: Negative for fever, chills, diaphoresis, appetite change and fatigue.  HENT: Negative for ear discharge, ear pain, sore throat and trouble swallowing.   Eyes: Negative for photophobia,  discharge and visual disturbance.  Respiratory: Negative for cough, choking, chest tightness and shortness of breath.   Cardiovascular: Negative for chest pain and palpitations.  Gastrointestinal: Negative for nausea, vomiting, abdominal pain, diarrhea, constipation, anal bleeding and rectal pain.  Endocrine: Negative for cold intolerance and heat intolerance.  Genitourinary: Negative for dysuria, frequency and difficulty urinating.  Musculoskeletal: Negative for gait problem, myalgias and neck pain.  Skin: Negative for color change, pallor and rash.  Allergic/Immunologic: Negative for environmental allergies, food allergies and immunocompromised state.  Neurological: Negative for dizziness, speech  difficulty, weakness and numbness.  Hematological: Negative for adenopathy.  Psychiatric/Behavioral: Negative for confusion and agitation. The patient is not nervous/anxious.        Objective:   Physical Exam  Constitutional: She is oriented to person, place, and time. She appears well-developed and well-nourished. No distress.  HENT:  Head: Normocephalic.  Mouth/Throat: Oropharynx is clear and moist. No oropharyngeal exudate.  Eyes: Conjunctivae and EOM are normal. Pupils are equal, round, and reactive to light. No scleral icterus.  Neck: Normal range of motion. Neck supple. No tracheal deviation present.  Cardiovascular: Normal rate, regular rhythm and intact distal pulses.   Pulmonary/Chest: Effort normal and breath sounds normal. No stridor. No respiratory distress. She exhibits no tenderness.  Abdominal: Soft. She exhibits no distension and no mass. There is no tenderness. Hernia confirmed negative in the right inguinal area and confirmed negative in the left inguinal area.  Genitourinary: No vaginal discharge found.  Musculoskeletal: Normal range of motion. She exhibits no tenderness.       Right elbow: She exhibits normal range of motion.       Left elbow: She exhibits normal range of motion.       Right wrist: She exhibits normal range of motion.       Left wrist: She exhibits normal range of motion.       Back:       Right hand: Normal strength noted.       Left hand: Normal strength noted.  Lymphadenopathy:       Head (right side): No posterior auricular adenopathy present.       Head (left side): No posterior auricular adenopathy present.    She has no cervical adenopathy.    She has no axillary adenopathy.       Right: No inguinal adenopathy present.       Left: No inguinal adenopathy present.  Neurological: She is alert and oriented to person, place, and time. No cranial nerve deficit. She exhibits normal muscle tone. Coordination normal.  Skin: Skin is warm and dry.  No rash noted. She is not diaphoretic. No erythema.  Psychiatric: She has a normal mood and affect. Her behavior is normal. Judgment and thought content normal.       Assessment:     Enlarging subcutaneous mass of the back.  Most likely cyst vs. Lipoma.     Plan:     Because it has gotten larger and concerns her daughter especially, it is reasonable to remove this.  I offered her options.  She would like to do it in the office under local anesthetic.  She has tolerated local blocks in the dental office without much problems.  We will work for a convenient time:  The pathophysiology of skin & subcutaneous masses was discussed.  Natural history risks without surgery were discussed.  I recommended surgery to remove the mass.  I explained the technique of removal with use of local anesthesia &  possible need for more aggressive sedation/anesthesia for patient comfort.    Risks such as bleeding, infection, heart attack, death, and other risks were discussed.  I noted a good likelihood this will help address the problem.   Possibility that this will not correct all symptoms was explained. Possibility of regrowth/recurrence of the mass was discussed.  We will work to minimize complications. Questions were answered.  The patient expresses understanding & wishes to proceed with surgery.

## 2013-05-17 ENCOUNTER — Ambulatory Visit (INDEPENDENT_AMBULATORY_CARE_PROVIDER_SITE_OTHER): Payer: Medicare Other | Admitting: Surgery

## 2013-05-17 ENCOUNTER — Encounter (INDEPENDENT_AMBULATORY_CARE_PROVIDER_SITE_OTHER): Payer: Self-pay | Admitting: Surgery

## 2013-05-17 VITALS — BP 138/80 | HR 74 | Temp 97.0°F | Resp 16 | Ht 64.5 in | Wt 214.4 lb

## 2013-05-17 DIAGNOSIS — L723 Sebaceous cyst: Secondary | ICD-10-CM | POA: Diagnosis not present

## 2013-05-17 DIAGNOSIS — R222 Localized swelling, mass and lump, trunk: Secondary | ICD-10-CM

## 2013-05-17 DIAGNOSIS — R229 Localized swelling, mass and lump, unspecified: Secondary | ICD-10-CM

## 2013-05-17 NOTE — Progress Notes (Signed)
Patient ID: Jennifer Curry, female   DOB: Feb 09, 1946, 68 y.o.   MRN: [id number]  Jennifer Curry  September 02, 1945 [id number]  Patient Care Team: Kathyrn Lass, MD as PCP - General (Family Medicine) Wenda Low, MD as Consulting Physician (Internal Medicine)  This patient is a 68 y.o.female who calls today for surgical evaluation.   The pathophysiology of subcutaneous masses and differential diagnosis was discussed.  Natural history progression was discussed.  The patient's symptoms are not adequately controlled.  The mass has not resolved.  Non-operative treatment has not healed the mass.  Therefore, I recommended excision of the mass. Technique, risks, benefits, alternatives discussed.  The patient expressed understanding & wished to proceed.  I placed a field block with local anaesthetic around the back mass in the left suprascapular SQ tissues.  I incised the skin over the mass in an ellipsoid biconcave fashion.  I sharply entered through the dermal layer into the subcutaneous tissues with a scalpel.  I was able to circumferentially come around the mass and remove it.  The specimen was sent for pathology evaluation.  I send the mass for pathological evaluation. I assured hemostasis.   Because there is no obvious infection, I decided to close the wound.  I closed it with deep dermal interrupted absorbable suture.  I closed skin with running subcuticular suture.  Steri-Strips are placed.  Sterile gauze and dressing applied.  The patient tolerated the procedure.   I discussed postop care with the patient in detail.  Instructions are written and given to them on discharge.  We will have the patient return to clinic for close follow up to make sure the incision heals.  Patient Active Problem List   Diagnosis Date Noted  . Mass on left upper back, 4cm SQ 05/07/2013  . Obesity (BMI 30-39.9) 05/07/2013    Past Medical History  Diagnosis Date  . Obesity (BMI 30-39.9) 05/07/2013    Past Surgical  History  Procedure Laterality Date  . Dilation and curettage of uterus    . Mouth surgery      History   Social History  . Marital Status: Widowed    Spouse Name: N/A    Number of Children: N/A  . Years of Education: N/A   Occupational History  . Not on file.   Social History Main Topics  . Smoking status: Never Smoker   . Smokeless tobacco: Not on file  . Alcohol Use: No  . Drug Use: No  . Sexual Activity: Not on file   Other Topics Concern  . Not on file   Social History Narrative  . No narrative on file    Family History  Problem Relation Age of Onset  . Heart disease Mother     Current Outpatient Prescriptions  Medication Sig Dispense Refill  . acetaminophen (TYLENOL) 325 MG tablet Take 650 mg by mouth every 6 (six) hours as needed (pain).      Marland Kitchen ibuprofen (ADVIL,MOTRIN) 800 MG tablet Take 1 tablet (800 mg total) by mouth 3 (three) times daily.  21 tablet  0   No current facility-administered medications for this visit.     Allergies  Allergen Reactions  . Shellfish Allergy Anaphylaxis  . Sulfa Antibiotics Anaphylaxis  . Oxycodone Nausea And Vomiting  . Vicodin [Hydrocodone-Acetaminophen] Nausea And Vomiting    BP 138/80  Pulse 74  Temp(Src) 97 F (36.1 C) (Temporal)  Resp 16  Ht 5' 4.5" (1.638 m)  Wt 214 lb 6.4 oz (97.251 kg)  BMI 36.25 kg/m2  No results found.  Note: This dictation was prepared with Dragon/digital dictation along with [employer]. Any transcriptional errors that result from this process are unintentional.

## 2013-05-17 NOTE — Patient Instructions (Signed)
GENERAL SURGERY: POST OP INSTRUCTIONS  1. DIET: Follow a light bland diet the first 24 hours after arrival home, such as soup, liquids, crackers, etc.  Be sure to include lots of fluids daily.  Avoid fast food or heavy meals as your are more likely to get nauseated.   2. Take your usually prescribed home medications unless otherwise directed. 3. PAIN CONTROL: a. Pain is best controlled by a usual combination of three different methods TOGETHER: i. Ice/Heat ii. Over the counter pain medication iii. Prescription pain medication b. Most patients will experience some swelling and bruising around the incisions.  Ice packs or heating pads (30-60 minutes up to 6 times a day) will help. Use ice for the first few days to help decrease swelling and bruising, then switch to heat to help relax tight/sore spots and speed recovery.  Some people prefer to use ice alone, heat alone, alternating between ice & heat.  Experiment to what works for you.  Swelling and bruising can take several weeks to resolve.   c. It is helpful to take an over-the-counter pain medication regularly for the first few weeks.  Choose one of the following that works best for you: i. Naproxen (Aleve, etc)  Two 274m tabs twice a day ii. Ibuprofen (Advil, etc) Three 2085mtabs four times a day (every meal & bedtime) iii. Acetaminophen (Tylenol, etc) 500-65021mour times a day (every meal & bedtime) d. A  prescription for pain medication (such as oxycodone, hydrocodone, etc) should be given to you upon discharge.  Take your pain medication as prescribed.  i. If you are having problems/concerns with the prescription medicine (does not control pain, nausea, vomiting, rash, itching, etc), please call us [phone] see if we need to switch you to a different pain medicine that will work better for you and/or control your side effect better. ii. If you need a refill on your pain medication, please contact your pharmacy.  They will contact our  office to request authorization. Prescriptions will not be filled after 5 pm or on week-ends. 4. Avoid getting constipated.  Between the surgery and the pain medications, it is common to experience some constipation.  Increasing fluid intake and taking a fiber supplement (such as Metamucil, Citrucel, FiberCon, MiraLax, etc) 1-2 times a day regularly will usually help prevent this problem from occurring.  A mild laxative (prune juice, Milk of Magnesia, MiraLax, etc) should be taken according to package directions if there are no bowel movements after 48 hours.   5. Wash / shower every day.  You may shower over the dressings as they are waterproof.  Continue to shower over incision(s) after the dressing is off. 6. Remove your waterproof bandages 5 days after surgery.  You may leave the incision open to air.  You may have skin tapes (Steri Strips) covering the incision(s).  Leave them on until one week, then remove.  You may replace a dressing/Band-Aid to cover the incision for comfort if you wish.      7. ACTIVITIES as tolerated:   a. You may resume regular (light) daily activities beginning the next day-such as daily self-care, walking, climbing stairs-gradually increasing activities as tolerated.  If you can walk 30 minutes without difficulty, it is safe to try more intense activity such as jogging, treadmill, bicycling, low-impact aerobics, swimming, etc. b. Save the most intensive and strenuous activity for last such as sit-ups, heavy lifting, contact sports, etc  Refrain from any heavy lifting or straining until you  are off narcotics for pain control.   c. DO NOT PUSH THROUGH PAIN.  Let pain be your guide: If it hurts to do something, don't do it.  Pain is your body warning you to avoid that activity for another week until the pain goes down. d. You may drive when you are no longer taking prescription pain medication, you can comfortably wear a seatbelt, and you can safely maneuver your car and apply  brakes. e. Jennifer Curry may have sexual intercourse when it is comfortable.  8. FOLLOW UP in our office a. Please call [facility] at [phone] to set up an appointment to see your surgeon in the office for a follow-up appointment approximately 2-3 weeks after your surgery. b. Make sure that you call for this appointment the day you arrive home to insure a convenient appointment time. 9. IF YOU HAVE DISABILITY OR FAMILY LEAVE FORMS, BRING THEM TO THE OFFICE FOR PROCESSING.  DO NOT GIVE THEM TO YOUR DOCTOR.   WHEN TO CALL us [phone]: 1. Poor pain control 2. Reactions / problems with new medications (rash/itching, nausea, etc)  3. Fever over 101.5 F (38.5 C) 4. Worsening swelling or bruising 5. Continued bleeding from incision. 6. Increased pain, redness, or drainage from the incision 7. Difficulty breathing / swallowing   The clinic staff is available to answer your questions during regular business hours (8:30am-5pm).  Please don't hesitate to call and ask to speak to one of our nurses for clinical concerns.   If you have a medical emergency, go to the nearest emergency room or call 911.  A surgeon from [facility] is always on call at the [facility], [place], [place], [place], [place]  [address] ? MAIN: [phone] ? TOLL FREE: [phone] ?  FAX [phone] [email or web address]  Managing Pain  Pain after surgery or related to activity is often due to strain/injury to muscle, tendon, nerves and/or incisions.  This pain is usually short-term and will improve in a few months.   Many people find it helpful to do the following things TOGETHER to help speed the process of healing and to get back to regular activity more quickly:  1. Avoid heavy physical activity a.  no lifting greater than 20 pounds b. Do not "push through" the pain.  Listen to your body and avoid positions and maneuvers than reproduce the  pain c. Walking is okay as tolerated, but go slowly and stop when getting sore.  d. Remember: If it hurts to do it, then don't do it! 2. Take Anti-inflammatory medication  a. Take with food/snack around the clock for 1-2 weeks i. This helps the muscle and nerve tissues become less irritable and calm down faster b. Choose ONE of the following over-the-counter medications: i. Naproxen 220mg  tabs (ex. Aleve) 1-2 pills twice a day  ii. Ibuprofen 200mg  tabs (ex. Advil, Motrin) 3-4 pills with every meal and just before bedtime iii. Acetaminophen 500mg  tabs (Tylenol) 1-2 pills with every meal and just before bedtime 3. Use a Heating pad or Ice/Cold Pack a. 4-6 times a day b. May use warm bath/hottub  or showers 4. Try Gentle Massage and/or Stretching  a. at the area of pain many times a day b. stop if you feel pain - do not overdo it  Try these steps together to help you body heal faster and avoid making things get worse.  Doing just one of these things may not be  enough.    If you are not getting better after two weeks or are noticing you are getting worse, contact our office for further advice; we may need to re-evaluate you & see what other things we can do to help.  Epidermal Cyst An epidermal cyst is sometimes called a sebaceous cyst, epidermal inclusion cyst, or infundibular cyst. These cysts usually contain a substance that looks "pasty" or "cheesy" and may have a bad smell. This substance is a protein called keratin. Epidermal cysts are usually found on the face, neck, or trunk. They may also occur in the vaginal area or other parts of the genitalia of both men and women. Epidermal cysts are usually small, painless, slow-growing bumps or lumps that move freely under the skin. It is important not to try to pop them. This may cause an infection and lead to tenderness and swelling. CAUSES  Epidermal cysts may be caused by a deep penetrating injury to the skin or a plugged hair follicle, often  associated with acne. SYMPTOMS  Epidermal cysts can become inflamed and cause:  Redness.  Tenderness.  Increased temperature of the skin over the bumps or lumps.  Grayish-white, bad smelling material that drains from the bump or lump. DIAGNOSIS  Epidermal cysts are easily diagnosed by your caregiver during an exam. Rarely, a tissue sample (biopsy) may be taken to rule out other conditions that may resemble epidermal cysts. TREATMENT   Epidermal cysts often get better and disappear on their own. They are rarely ever cancerous.  If a cyst becomes infected, it may become inflamed and tender. This may require opening and draining the cyst. Treatment with antibiotics may be necessary. When the infection is gone, the cyst may be removed with minor surgery.  Small, inflamed cysts can often be treated with antibiotics or by injecting steroid medicines.  Sometimes, epidermal cysts become large and bothersome. If this happens, surgical removal in your caregiver's office may be necessary. HOME CARE INSTRUCTIONS  Only take over-the-counter or prescription medicines as directed by your caregiver.  Take your antibiotics as directed. Finish them even if you start to feel better. SEEK MEDICAL CARE IF:   Your cyst becomes tender, red, or swollen.  Your condition is not improving or is getting worse.  You have any other questions or concerns. MAKE SURE YOU:  Understand these instructions.  Will watch your condition.  Will get help right away if you are not doing well or get worse. Document Released: 01/10/2004 Document Revised: 05/03/2011 Document Reviewed: 08/17/2010 [facility] Patient Information 2014 [place], [place].

## 2013-05-17 NOTE — Addendum Note (Signed)
Addended by: Illene Regulus on: 05/17/2013 02:59 PM   Modules accepted: Orders

## 2013-05-21 ENCOUNTER — Telehealth (INDEPENDENT_AMBULATORY_CARE_PROVIDER_SITE_OTHER): Payer: Self-pay | Admitting: *Deleted

## 2013-05-21 NOTE — Telephone Encounter (Signed)
Pt called wanting results of her Pathology report.  I advised her that it was benign.  Also asked pt how her recovery was going and pt advised me she only had pain the night it was removed and she was doing very well.  Pt was pleased with the results.Anderson [place]

## 2013-06-11 ENCOUNTER — Ambulatory Visit (INDEPENDENT_AMBULATORY_CARE_PROVIDER_SITE_OTHER): Payer: Medicare Other | Admitting: Surgery

## 2013-06-11 ENCOUNTER — Encounter (INDEPENDENT_AMBULATORY_CARE_PROVIDER_SITE_OTHER): Payer: Self-pay | Admitting: Surgery

## 2013-06-11 VITALS — BP 112/80 | HR 78 | Temp 98.7°F | Resp 16 | Ht 64.0 in | Wt 213.0 lb

## 2013-06-11 DIAGNOSIS — L700 Acne vulgaris: Secondary | ICD-10-CM | POA: Insufficient documentation

## 2013-06-11 DIAGNOSIS — L708 Other acne: Secondary | ICD-10-CM

## 2013-06-11 DIAGNOSIS — L723 Sebaceous cyst: Secondary | ICD-10-CM

## 2013-06-11 DIAGNOSIS — L720 Epidermal cyst: Secondary | ICD-10-CM

## 2013-06-11 NOTE — Patient Instructions (Signed)
Epidermal Cyst An epidermal cyst is sometimes called a sebaceous cyst, epidermal inclusion cyst, or infundibular cyst. These cysts usually contain a substance that looks "pasty" or "cheesy" and may have a bad smell. This substance is a protein called keratin. Epidermal cysts are usually found on the face, neck, or trunk. They may also occur in the vaginal area or other parts of the genitalia of both men and women. Epidermal cysts are usually small, painless, slow-growing bumps or lumps that move freely under the skin. It is important not to try to pop them. This may cause an infection and lead to tenderness and swelling. CAUSES  Epidermal cysts may be caused by a deep penetrating injury to the skin or a plugged hair follicle, often associated with acne. SYMPTOMS  Epidermal cysts can become inflamed and cause:  Redness.  Tenderness.  Increased temperature of the skin over the bumps or lumps.  Grayish-white, bad smelling material that drains from the bump or lump. DIAGNOSIS  Epidermal cysts are easily diagnosed by your caregiver during an exam. Rarely, a tissue sample (biopsy) may be taken to rule out other conditions that may resemble epidermal cysts. TREATMENT   Epidermal cysts often get better and disappear on their own. They are rarely ever cancerous.  If a cyst becomes infected, it may become inflamed and tender. This may require opening and draining the cyst. Treatment with antibiotics may be necessary. When the infection is gone, the cyst may be removed with minor surgery.  Small, inflamed cysts can often be treated with antibiotics or by injecting steroid medicines.  Sometimes, epidermal cysts become large and bothersome. If this happens, surgical removal in your caregiver's office may be necessary. HOME CARE INSTRUCTIONS  Only take over-the-counter or prescription medicines as directed by your caregiver.  Take your antibiotics as directed. Finish them even if you start to feel  better. SEEK MEDICAL CARE IF:   Your cyst becomes tender, red, or swollen.  Your condition is not improving or is getting worse.  You have any other questions or concerns. MAKE SURE YOU:  Understand these instructions.  Will watch your condition.  Will get help right away if you are not doing well or get worse. Document Released: 01/10/2004 Document Revised: 05/03/2011 Document Reviewed: 08/17/2010 [facility] Patient Information 2014 [place], [place].  Acne Acne is a skin problem that causes pimples. Acne occurs when the pores in your skin get blocked. Your pores may become red, sore, and swollen (inflamed), or infected with a common skin bacterium (Propionibacterium acnes). Acne is a common skin problem. Up to 80% of people get acne at some time. Acne is especially common from the ages of 42 to 49. Acne usually goes away over time with proper treatment. CAUSES  Your pores each contain an oil gland. The oil glands make an oily substance called sebum. Acne happens when these glands get plugged with sebum, dead skin cells, and dirt. The P. acnes bacteria that are normally found in the oil glands then multiply, causing inflammation. Acne is commonly triggered by changes in your hormones. These hormonal changes can cause the oil glands to get bigger and to make more sebum. Factors that can make acne worse include:  Hormone changes during adolescence.  Hormone changes during women's menstrual cycles.  Hormone changes during pregnancy.  Oil-based cosmetics and hair products.  Harshly scrubbing the skin.  Strong soaps.  Stress.  Hormone problems due to certain diseases.  Long or oily hair rubbing against the skin.  Certain medicines.  Pressure from headbands, backpacks, or shoulder pads.  Exposure to certain oils and chemicals. SYMPTOMS  Acne often occurs on the face, neck, chest, and upper back. Symptoms include:  Small, red bumps (pimples or papules).  Whiteheads (closed  comedones).  Blackheads (open comedones).  Small, pus-filled pimples (pustules).  Big, red pimples or pustules that feel tender. More severe acne can cause:  An infected area that contains a collection of pus (abscess).  Hard, painful, fluid-filled sacs (cysts).  Scars. DIAGNOSIS  Your caregiver can usually tell what the problem is by doing a physical exam. TREATMENT  There are many good treatments for acne. Some are available over-the-counter and some are available with a prescription. The treatment that is best for you depends on the type of acne you have and how severe it is. It may take 2 months of treatment before your acne gets better. Common treatments include:  Creams and lotions that prevent oil glands from clogging.  Creams and lotions that treat or prevent infections and inflammation.  Antibiotics applied to the skin or taken as a pill.  Pills that decrease sebum production.  Birth control pills.  Light or laser treatments.  Minor surgery.  Injections of medicine into the affected areas.  Chemicals that cause peeling of the skin. HOME CARE INSTRUCTIONS  Good skin care is the most important part of treatment.  Wash your skin gently at least twice a day and after exercise. Always wash your skin before bed.  Use mild soap.  After each wash, apply a water-based skin moisturizer.  Keep your hair clean and off of your face. Shampoo your hair daily.  Only take medicines as directed by your caregiver.  Use a sunscreen or sunblock with SPF 30 or greater. This is especially important when you are using acne medicines.  Choose cosmetics that are noncomedogenic. This means they do not plug the oil glands.  Avoid leaning your chin or forehead on your hands.  Avoid wearing tight headbands or hats.  Avoid picking or squeezing your pimples. This can make your acne worse and cause scarring. SEEK MEDICAL CARE IF:   Your acne is not better after 8 weeks.  Your  acne gets worse.  You have a large area of skin that is red or tender. Document Released: 02/06/2000 Document Revised: 05/03/2011 Document Reviewed: 11/27/2010 [facility] Patient Information 2014 [place], [place].

## 2013-06-11 NOTE — Progress Notes (Signed)
Subjective:     Patient ID: Jennifer Curry, female   DOB: 02-Mar-1945, 68 y.o.   MRN: [id number]  HPI  Note: This dictation was prepared with Dragon/digital dictation along with [facility] [address] technology. Any transcriptional errors that result from this process are unintentional.       Jennifer Curry  Mar 22, 1945 [id number]  Patient Care Team: Kathyrn Lass, MD as PCP - General (Family Medicine) Wenda Low, MD as Consulting Physician (Internal Medicine)  Procedure (Date: 05/17/2013):  Removal of back mass  Pathology consistent with epidermoid cyst  This patient returns for surgical re-evaluation.  She feels good.  Only needed ibuprofen a few times.  Using arm well.  No fevers or chills.  No drainage.  She has noted a small lump on her left cheek that showed up a few weeks ago.  She scratched it once or twice but  tries to avoid touching it.  No major pus.  She wondered if something needed to be done about it.  Patient Active Problem List   Diagnosis Date Noted  . Epidermal inclusion cyst of back s/p excision 05/17/2013 05/07/2013  . Obesity (BMI 30-39.9) 05/07/2013    Past Medical History  Diagnosis Date  . Obesity (BMI 30-39.9) 05/07/2013    Past Surgical History  Procedure Laterality Date  . Dilation and curettage of uterus    . Mouth surgery      History   Social History  . Marital Status: Widowed    Spouse Name: N/A    Number of Children: N/A  . Years of Education: N/A   Occupational History  . Not on file.   Social History Main Topics  . Smoking status: Never Smoker   . Smokeless tobacco: Not on file  . Alcohol Use: No  . Drug Use: No  . Sexual Activity: Not on file   Other Topics Concern  . Not on file   Social History Narrative  . No narrative on file    Family History  Problem Relation Age of Onset  . Heart disease Mother     Current Outpatient Prescriptions  Medication Sig Dispense Refill  . acetaminophen (TYLENOL) 325 MG tablet Take 650  mg by mouth every 6 (six) hours as needed (pain).      Marland Kitchen ibuprofen (ADVIL,MOTRIN) 800 MG tablet Take 1 tablet (800 mg total) by mouth 3 (three) times daily.  21 tablet  0   No current facility-administered medications for this visit.     Allergies  Allergen Reactions  . Shellfish Allergy Anaphylaxis  . Sulfa Antibiotics Anaphylaxis  . Oxycodone Nausea And Vomiting  . Vicodin [Hydrocodone-Acetaminophen] Nausea And Vomiting    BP 112/80  Pulse 78  Temp(Src) 98.7 F (37.1 C)  Resp 16  Ht 5\' 4"  (1.626 m)  Wt 213 lb (96.616 kg)  BMI 36.54 kg/m2  No results found.  Review of Systems  Constitutional: Negative for fever, chills and diaphoresis.  HENT: Negative for ear pain, sore throat and trouble swallowing.   Eyes: Negative for photophobia and visual disturbance.  Respiratory: Negative for cough and choking.   Cardiovascular: Negative for chest pain and palpitations.  Gastrointestinal: Negative for nausea, vomiting, abdominal pain, diarrhea, constipation, anal bleeding and rectal pain.  Genitourinary: Negative for dysuria, frequency and difficulty urinating.  Musculoskeletal: Negative for gait problem and myalgias.  Skin: Negative for color change, pallor and rash.  Neurological: Negative for dizziness, speech difficulty, weakness and numbness.  Hematological: Negative for adenopathy.  Psychiatric/Behavioral: Negative for confusion and  agitation. The patient is not nervous/anxious.        Objective:   Physical Exam  Constitutional: She is oriented to person, place, and time. She appears well-developed and well-nourished. No distress.  HENT:  Head: Normocephalic and atraumatic.    Mouth/Throat: Oropharynx is clear and moist. No oropharyngeal exudate.  Eyes: Conjunctivae and EOM are normal. Pupils are equal, round, and reactive to light. No scleral icterus.  Neck: Normal range of motion. No tracheal deviation present.  Cardiovascular: Normal rate and intact distal pulses.     Pulmonary/Chest: Effort normal. No respiratory distress.   She exhibits no tenderness.  Abdominal: Soft. She exhibits no distension. There is no tenderness. Hernia confirmed negative in the right inguinal area and confirmed negative in the left inguinal area.  Genitourinary: No vaginal discharge found.  Musculoskeletal: Normal range of motion. She exhibits no tenderness.  Lymphadenopathy:       Right: No inguinal adenopathy present.       Left: No inguinal adenopathy present.  Neurological: She is alert and oriented to person, place, and time. No cranial nerve deficit. She exhibits normal muscle tone. Coordination normal.  Skin: Skin is warm and dry. No rash noted. She is not diaphoretic.  Psychiatric: She has a normal mood and affect. Her behavior is normal.       Assessment:     Recovering well from excision of back mass consistent with benign cyst.  New small lesion on left cheek.  Most likely small inflamed/irritated dot of acne.     Plan:     I think she will continue to recover well from her incision her back.  She feels reassured.  A small spot on her left cheek is most likely acne.  Keep the area clean and dry.  Consider small Band-Aid to protect againast scratching/trauma.  Should improve.  If not improved or worsening, may benefit from a trial of oral antibiotics.  I will defer to her primary care physician.  And requires more aggressive management at this time.  She was wondering about alcohol.  I told her to keep it simple and avoid any type of topical/PO therapy at this time.  Increase activity as tolerated to regular activity.  Low impact exercise such as walking an hour a day at least ideal.  Do not push through pain.   Return to clinic as needed.   Instructions discussed.  Followup with primary care physician for other health issues as would normally be done.  Consider screening for malignancies (breast, prostate, colon, melanoma, etc) as appropriate.  Questions  answered.  The patient expressed understanding and appreciation

## 2013-08-01 DIAGNOSIS — J209 Acute bronchitis, unspecified: Secondary | ICD-10-CM | POA: Diagnosis not present

## 2013-08-01 DIAGNOSIS — J449 Chronic obstructive pulmonary disease, unspecified: Secondary | ICD-10-CM | POA: Diagnosis not present

## 2013-08-01 DIAGNOSIS — J029 Acute pharyngitis, unspecified: Secondary | ICD-10-CM | POA: Diagnosis not present

## 2014-04-22 DIAGNOSIS — R0989 Other specified symptoms and signs involving the circulatory and respiratory systems: Secondary | ICD-10-CM | POA: Diagnosis not present

## 2014-04-22 DIAGNOSIS — R531 Weakness: Secondary | ICD-10-CM | POA: Diagnosis not present

## 2014-04-22 DIAGNOSIS — R591 Generalized enlarged lymph nodes: Secondary | ICD-10-CM | POA: Diagnosis not present

## 2014-04-22 DIAGNOSIS — J029 Acute pharyngitis, unspecified: Secondary | ICD-10-CM | POA: Diagnosis not present

## 2014-04-29 DIAGNOSIS — R05 Cough: Secondary | ICD-10-CM | POA: Diagnosis not present

## 2014-10-24 DIAGNOSIS — R0600 Dyspnea, unspecified: Secondary | ICD-10-CM | POA: Diagnosis not present

## 2014-10-24 DIAGNOSIS — M25562 Pain in left knee: Secondary | ICD-10-CM | POA: Diagnosis not present

## 2014-11-12 DIAGNOSIS — J209 Acute bronchitis, unspecified: Secondary | ICD-10-CM | POA: Diagnosis not present

## 2014-11-12 DIAGNOSIS — J069 Acute upper respiratory infection, unspecified: Secondary | ICD-10-CM | POA: Diagnosis not present

## 2014-11-14 ENCOUNTER — Encounter: Payer: Self-pay | Admitting: Emergency Medicine

## 2014-11-14 ENCOUNTER — Ambulatory Visit (INDEPENDENT_AMBULATORY_CARE_PROVIDER_SITE_OTHER): Payer: Medicare Other | Admitting: Emergency Medicine

## 2014-11-14 VITALS — BP 116/78 | HR 79 | Wt 213.0 lb

## 2014-11-14 DIAGNOSIS — R053 Chronic cough: Secondary | ICD-10-CM

## 2014-11-14 DIAGNOSIS — R05 Cough: Secondary | ICD-10-CM | POA: Diagnosis not present

## 2014-11-14 MED ORDER — PANTOPRAZOLE SODIUM 40 MG PO TBEC: 40.0000 mg | DELAYED_RELEASE_TABLET | Freq: Every day | ORAL | Status: DC

## 2014-11-14 MED ORDER — LORATADINE 10 MG PO TABS: 10.0000 mg | ORAL_TABLET | Freq: Every day | ORAL | Status: DC

## 2014-11-14 NOTE — Progress Notes (Signed)
Subjective:    Patient ID: Jennifer Curry, female    DOB: Jul 20, 1945, 69 y.o.   MRN: [id number]  HPI 69 yo never smoker with history of allergic rhinitis, followed at [facility]. Referred for dyspnea and long standing cough / bronchitis symptoms. She tells me that she was last well this Summer. She was coughing severely in the Spring. Cough is often non-prod. She wakes up most am with cough. Very little nasal gtt. She sometimes feels that she is congested. Can often be associated with dyspnea. Can be associated with chest heaviness, chest pain.  She has been rx with BD's before, may help her, open her up. Cigarette smoke is a trigger.  Seems to be the worst in the Fall.  Currently being treated with azithro and pred x 10 days. This is helping her. She does not perceive heartburn. She uses Hall's cough drops.    Review of Systems  Constitutional: Negative for fever and unexpected weight change.  HENT: Negative for congestion, dental problem, ear pain, nosebleeds, postnasal drip, rhinorrhea, sinus pressure, sneezing, sore throat and trouble swallowing.   Eyes: Negative for redness and itching.  Respiratory: Positive for cough and shortness of breath. Negative for chest tightness and wheezing.   Cardiovascular: Negative for palpitations and leg swelling.  Gastrointestinal: Negative for nausea and vomiting.  Genitourinary: Negative for dysuria.  Musculoskeletal: Negative for joint swelling.  Skin: Negative for rash.  Neurological: Negative for headaches.  Hematological: Does not bruise/bleed easily.  Psychiatric/Behavioral: Negative for dysphoric mood. The patient is not nervous/anxious.      Past Medical History  Diagnosis Date  . Obesity (BMI 30-39.9) 05/07/2013  . Allergic rhinitis      Family History  Problem Relation Age of Onset  . Heart disease Mother      Social History   Social History  . Marital Status: Widowed    Spouse Name: N/A  . Number of Children:  N/A  . Years of Education: N/A   Occupational History  . Not on file.   Social History Main Topics  . Smoking status: Never Smoker   . Smokeless tobacco: Not on file  . Alcohol Use: No  . Drug Use: No  . Sexual Activity: Not on file   Other Topics Concern  . Not on file   Social History Narrative     Allergies  Allergen Reactions  . Shellfish Allergy Anaphylaxis  . Sulfa Antibiotics Anaphylaxis  . Oxycodone Nausea And Vomiting  . Vicodin [Hydrocodone-Acetaminophen] Nausea And Vomiting     Outpatient Prescriptions Prior to Visit  Medication Sig Dispense Refill  . acetaminophen (TYLENOL) 325 MG tablet Take 650 mg by mouth every 6 (six) hours as needed (pain).    Marland Kitchen ibuprofen (ADVIL,MOTRIN) 800 MG tablet Take 1 tablet (800 mg total) by mouth 3 (three) times daily. 21 tablet 0   No facility-administered medications prior to visit.   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 Has been exposed to mold before.  [place] native.        Objective:   Physical Exam Filed Vitals:   11/14/14 1630 11/14/14 1631  BP:  116/78  Pulse:  79  Weight: 213 lb (96.616 kg)   SpO2:  94%   Gen: Pleasant, well-nourished, in no distress,  normal affect, frequent cough today  ENT: No lesions,  mouth clear,  oropharynx clear, no postnasal drip  Neck: No JVD, no TMG, no carotid bruits  Lungs:  No use of accessory muscles, clear without rales or rhonchi  Cardiovascular: RRR, heart sounds normal, no murmur or gallops, no peripheral edema  Musculoskeletal: No deformities, no cyanosis or clubbing  Neuro: alert, non focal  Skin: Warm, no lesions or rashes      Assessment & Plan:  Chronic cough Suspect multifactorial. She has some history that sounds like allergic rhinitis. No real history of GERD that I can discern. Also he would like to treat her empirically for both. I gave her instructions on avoidance of caffeine, chocolate, mentholated cough drops. She will  complete her prednisone and azithromycin that started been ordered. Use cough syrup for suppression. Start pantoprazole, loratadine. We will perform pulmonary function testing at her follow-up visit.   We will perform full pulmonary function testing at your next visit.  We will start pantoprazole 40mg  daily until next visit.  We will start loratadine 10mg  daily Try to stop using menthol cough drops Try to minimize your caffeine, chocolate  Finish your azithromycin and prednisone.  Use your cough syrup as needed  Follow with Dr Lamonte Sakai next available with full PFT.

## 2014-11-14 NOTE — Patient Instructions (Signed)
We will perform full pulmonary function testing at your next visit.  We will start pantoprazole 40mg  daily until next visit.  We will start loratadine 10mg  daily Try to stop using menthol cough drops Try to minimize your caffeine, chocolate  Finish your azithromycin and prednisone.  Use your cough syrup as needed  Follow with Dr Lamonte Sakai next available with full PFT.

## 2014-11-14 NOTE — Assessment & Plan Note (Signed)
Suspect multifactorial. She has some history that sounds like allergic rhinitis. No real history of GERD that I can discern. Also he would like to treat her empirically for both. I gave her instructions on avoidance of caffeine, chocolate, mentholated cough drops. She will complete her prednisone and azithromycin that started been ordered. Use cough syrup for suppression. Start pantoprazole, loratadine. We will perform pulmonary function testing at her follow-up visit.   We will perform full pulmonary function testing at your next visit.  We will start pantoprazole 40mg  daily until next visit.  We will start loratadine 10mg  daily Try to stop using menthol cough drops Try to minimize your caffeine, chocolate  Finish your azithromycin and prednisone.  Use your cough syrup as needed  Follow with Dr Lamonte Sakai next available with full PFT.

## 2014-11-20 ENCOUNTER — Other Ambulatory Visit (HOSPITAL_COMMUNITY)
Admission: RE | Admit: 2014-11-20 | Discharge: 2014-11-20 | Disposition: A | Discharge status: Home / Self Care | Payer: Medicare Other | Source: Ambulatory Visit | Attending: Family Medicine | Admitting: Family Medicine

## 2014-11-20 ENCOUNTER — Other Ambulatory Visit: Payer: Self-pay

## 2014-11-20 DIAGNOSIS — E780 Pure hypercholesterolemia: Secondary | ICD-10-CM | POA: Diagnosis not present

## 2014-11-20 DIAGNOSIS — E559 Vitamin D deficiency, unspecified: Secondary | ICD-10-CM | POA: Diagnosis not present

## 2014-11-20 DIAGNOSIS — R5383 Other fatigue: Secondary | ICD-10-CM | POA: Diagnosis not present

## 2014-11-20 DIAGNOSIS — Z124 Encounter for screening for malignant neoplasm of cervix: Secondary | ICD-10-CM | POA: Insufficient documentation

## 2014-11-20 DIAGNOSIS — R062 Wheezing: Secondary | ICD-10-CM | POA: Diagnosis not present

## 2014-11-20 DIAGNOSIS — Z1239 Encounter for other screening for malignant neoplasm of breast: Secondary | ICD-10-CM | POA: Diagnosis not present

## 2014-11-20 DIAGNOSIS — Z01419 Encounter for gynecological examination (general) (routine) without abnormal findings: Secondary | ICD-10-CM | POA: Diagnosis not present

## 2014-11-20 DIAGNOSIS — Z0000 Encounter for general adult medical examination without abnormal findings: Secondary | ICD-10-CM | POA: Diagnosis not present

## 2014-11-20 DIAGNOSIS — E669 Obesity, unspecified: Secondary | ICD-10-CM | POA: Diagnosis not present

## 2014-11-20 DIAGNOSIS — R05 Cough: Secondary | ICD-10-CM | POA: Diagnosis not present

## 2014-11-22 LAB — CYTOLOGY - PAP

## 2014-12-12 DIAGNOSIS — Z1231 Encounter for screening mammogram for malignant neoplasm of breast: Secondary | ICD-10-CM | POA: Diagnosis not present

## 2014-12-12 DIAGNOSIS — M85852 Other specified disorders of bone density and structure, left thigh: Secondary | ICD-10-CM | POA: Diagnosis not present

## 2014-12-24 DIAGNOSIS — E669 Obesity, unspecified: Secondary | ICD-10-CM | POA: Diagnosis not present

## 2014-12-24 DIAGNOSIS — E7800 Pure hypercholesterolemia, unspecified: Secondary | ICD-10-CM | POA: Diagnosis not present

## 2014-12-24 DIAGNOSIS — R062 Wheezing: Secondary | ICD-10-CM | POA: Diagnosis not present

## 2014-12-24 DIAGNOSIS — R5383 Other fatigue: Secondary | ICD-10-CM | POA: Diagnosis not present

## 2014-12-24 DIAGNOSIS — E559 Vitamin D deficiency, unspecified: Secondary | ICD-10-CM | POA: Diagnosis not present

## 2014-12-24 DIAGNOSIS — M8580 Other specified disorders of bone density and structure, unspecified site: Secondary | ICD-10-CM | POA: Diagnosis not present

## 2014-12-26 ENCOUNTER — Ambulatory Visit: Payer: Medicare Other | Admitting: Emergency Medicine

## 2015-01-24 ENCOUNTER — Encounter: Payer: Self-pay | Admitting: Emergency Medicine

## 2015-01-24 ENCOUNTER — Ambulatory Visit (INDEPENDENT_AMBULATORY_CARE_PROVIDER_SITE_OTHER): Payer: Medicare Other | Admitting: Emergency Medicine

## 2015-01-24 VITALS — BP 120/86 | HR 76 | Ht 65.0 in | Wt 201.0 lb

## 2015-01-24 DIAGNOSIS — R05 Cough: Secondary | ICD-10-CM

## 2015-01-24 DIAGNOSIS — R053 Chronic cough: Secondary | ICD-10-CM

## 2015-01-24 LAB — PULMONARY FUNCTION TEST
DL/VA % PRED: 82 %
DL/VA: 4.06 ml/min/mmHg/L
DLCO UNC % PRED: 75 %
DLCO UNC: 19.41 ml/min/mmHg
FEF 25-75 POST: 3.16 L/s
FEF 25-75 Pre: 2.56 L/sec
FEF2575-%CHANGE-POST: 23 %
FEF2575-%PRED-POST: 156 %
FEF2575-%PRED-PRE: 126 %
FEV1-%Change-Post: 4 %
FEV1-%Pred-Post: 101 %
FEV1-%Pred-Pre: 96 %
FEV1-POST: 2.44 L
FEV1-Pre: 2.33 L
FEV1FVC-%Change-Post: 0 %
FEV1FVC-%PRED-PRE: 108 %
FEV6-%CHANGE-POST: 5 %
FEV6-%PRED-POST: 97 %
FEV6-%Pred-Pre: 92 %
FEV6-PRE: 2.79 L
FEV6-Post: 2.95 L
FEV6FVC-%CHANGE-POST: 0 %
FEV6FVC-%Pred-Post: 104 %
FEV6FVC-%Pred-Pre: 103 %
FVC-%CHANGE-POST: 4 %
FVC-%Pred-Post: 93 %
FVC-%Pred-Pre: 89 %
FVC-Post: 2.95 L
FVC-Pre: 2.83 L
POST FEV1/FVC RATIO: 82 %
Post FEV6/FVC ratio: 100 %
Pre FEV1/FVC ratio: 82 %
Pre FEV6/FVC Ratio: 99 %
RV % pred: 134 %
RV: 2.97 L
TLC % pred: 118 %
TLC: 6.15 L

## 2015-01-24 NOTE — Assessment & Plan Note (Signed)
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 There is no evidence for airflow obstruction.We reviewed the contributors including allergic rhinitis and GERD. She will continue her loratadine,  Continue to work on diet and exercise, avoid upper airway irritants and throat clearing.   Please continue your loratadine as you are taking it Stay off the pantoprazole. Congratulations on your weight loss! Keep up the good work with your diet.  Your pulmonary function testing does noyt show asthma, but it does show that your upper airway and vocal cords are irritable.  Avoid throat clearing, mints, chocolate, eating after 8pm.  Practice voice rest as you are able.  Follow with Dr Lamonte Sakai in 6 months or sooner if you have any problems

## 2015-01-24 NOTE — Progress Notes (Signed)
Subjective:    Patient ID: Jennifer Curry, female    DOB: [birth year], 69 y.o.   MRN: [id number]  HPI 69 yo never smoker with history of allergic rhinitis, followed at [facility]. Referred for dyspnea and long standing cough / bronchitis symptoms. She tells me that she was last well this Summer. She was coughing severely in the Spring. Cough is often non-prod. She wakes up most am with cough. Very little nasal gtt. She sometimes feels that she is congested. Can often be associated with dyspnea. Can be associated with chest heaviness, chest pain.  She has been rx with BD's before, may help her, open her up. Cigarette smoke is a trigger.  Seems to be the worst in the Fall.  Currently being treated with azithro and pred x 10 days. This is helping her. She does not perceive heartburn. She uses Hall's cough drops.   ROV 01/24/15 -- follow-up visit for chronic cough. She underwent full pulmonary function testing today Which I have personally reviewed. This shows normal airflows without any significant bronchodilator responsiveness, borderline hyperinflated volumes (doesn't meet official criteria) and a decreased diffusion capacity that corrects to the normal range for alveolar volume. Her flow-volume loops show significant reproducible variability of the inspiratory loops consistent with VCD. At last visit we started pantoprazole, loratadine. Now off the pantoprazole, still own the  She was completing pred / azithro at the time. Returns reporting that she feels better than last time. She has been been working on losing weight on a diet, has lost ~ 30 lbs! Avoiding potential allergens.    Review of Systems  Constitutional: Negative for fever and unexpected weight change.  HENT: Negative for congestion, dental problem, ear pain, nosebleeds, postnasal drip, rhinorrhea, sinus pressure, sneezing, sore throat and trouble swallowing.   Eyes: Negative for redness and itching.  Respiratory: Positive for  cough and shortness of breath. Negative for chest tightness and wheezing.   Cardiovascular: Negative for palpitations and leg swelling.  Gastrointestinal: Negative for nausea and vomiting.  Genitourinary: Negative for dysuria.  Musculoskeletal: Negative for joint swelling.  Skin: Negative for rash.  Neurological: Negative for headaches.  Hematological: Does not bruise/bleed easily.  Psychiatric/Behavioral: Negative for dysphoric mood. The patient is not nervous/anxious.      Past Medical History  Diagnosis Date  . Obesity (BMI 30-39.9) 05/07/2013  . Allergic rhinitis      Family History  Problem Relation Age of Onset  . Heart disease Mother      Social History   Social History  . Marital Status: Widowed    Spouse Name: N/A  . Number of Children: N/A  . Years of Education: N/A   Occupational History  . Not on file.   Social History Main Topics  . Smoking status: Never Smoker   . Smokeless tobacco: Not on file  . Alcohol Use: No  . Drug Use: No  . Sexual Activity: Not on file   Other Topics Concern  . Not on file   Social History Narrative     Allergies  Allergen Reactions  . Shellfish Allergy Anaphylaxis  . Sulfa Antibiotics Anaphylaxis  . Oxycodone Nausea And Vomiting  . Vicodin [Hydrocodone-Acetaminophen] Nausea And Vomiting     Outpatient Prescriptions Prior to Visit  Medication Sig Dispense Refill  . loratadine (CLARITIN) 10 MG tablet Take 1 tablet (10 mg total) by mouth daily. (Patient not taking: Reported on 01/24/2015) 30 tablet 5  . azithromycin (ZITHROMAX Z-PAK) 250 MG tablet Take 2  tablets on day 1, then 1 daily until gone    . pantoprazole (PROTONIX) 40 MG tablet Take 1 tablet (40 mg total) by mouth daily. 30 tablet 5  . predniSONE (DELTASONE) 10 MG tablet As directed     No facility-administered medications prior to visit.   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 Has been exposed to mold before.  [place] native.         Objective:   Physical Exam Filed Vitals:   01/24/15 1322 01/24/15 1326  BP:  120/86  Pulse:  76  Height: 5\' 5"  (1.651 m)   Weight: 201 lb (91.173 kg)   SpO2:  96%   Gen: Pleasant, well-nourished, in no distress,  normal affect, frequent cough today  ENT: No lesions,  mouth clear,  oropharynx clear, no postnasal drip  Neck: No JVD, no TMG, no carotid bruits  Lungs: No use of accessory muscles, clear without rales or rhonchi  Cardiovascular: RRR, heart sounds normal, no murmur or gallops, no peripheral edema  Musculoskeletal: No deformities, no cyanosis or clubbing  Neuro: alert, non focal  Skin: Warm, no lesions or rashes      Assessment & Plan:  Chronic cough 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 There is no evidence for airflow obstruction.We reviewed the contributors including allergic rhinitis and GERD. She will continue her loratadine,  Continue to work on diet and exercise, avoid upper airway irritants and throat clearing.   Please continue your loratadine as you are taking it Stay off the pantoprazole. Congratulations on your weight loss! Keep up the good work with your diet.  Your pulmonary function testing does noyt show asthma, but it does show that your upper airway and vocal cords are irritable.  Avoid throat clearing, mints, chocolate, eating after 8pm.  Practice voice rest as you are able.  Follow with Dr Lamonte Sakai in 6 months or sooner if you have any problems

## 2015-01-24 NOTE — Patient Instructions (Signed)
Please continue your loratadine as you are taking it Stay off the pantoprazole. Congratulations on your weight loss! Keep up the good work with your diet.  Your pulmonary function testing does noyt show asthma, but it does show that your upper airway and vocal cords are irritable.  Avoid throat clearing, mints, chocolate, eating after 8pm.  Practice voice rest as you are able.  Follow with Dr Lamonte Sakai in 6 months or sooner if you have any problems

## 2015-01-24 NOTE — Progress Notes (Signed)
PFT done today. 

## 2015-03-27 DIAGNOSIS — E669 Obesity, unspecified: Secondary | ICD-10-CM | POA: Diagnosis not present

## 2015-03-27 DIAGNOSIS — E7800 Pure hypercholesterolemia, unspecified: Secondary | ICD-10-CM | POA: Diagnosis not present

## 2015-03-27 DIAGNOSIS — R062 Wheezing: Secondary | ICD-10-CM | POA: Diagnosis not present

## 2015-03-27 DIAGNOSIS — E559 Vitamin D deficiency, unspecified: Secondary | ICD-10-CM | POA: Diagnosis not present

## 2015-03-27 DIAGNOSIS — M8580 Other specified disorders of bone density and structure, unspecified site: Secondary | ICD-10-CM | POA: Diagnosis not present

## 2015-04-16 DIAGNOSIS — S161XXA Strain of muscle, fascia and tendon at neck level, initial encounter: Secondary | ICD-10-CM | POA: Diagnosis not present

## 2015-04-21 DIAGNOSIS — S161XXA Strain of muscle, fascia and tendon at neck level, initial encounter: Secondary | ICD-10-CM | POA: Diagnosis not present

## 2015-08-01 DIAGNOSIS — E7800 Pure hypercholesterolemia, unspecified: Secondary | ICD-10-CM | POA: Diagnosis not present

## 2015-08-01 DIAGNOSIS — E559 Vitamin D deficiency, unspecified: Secondary | ICD-10-CM | POA: Diagnosis not present

## 2015-08-01 DIAGNOSIS — E669 Obesity, unspecified: Secondary | ICD-10-CM | POA: Diagnosis not present

## 2015-08-01 DIAGNOSIS — Z1211 Encounter for screening for malignant neoplasm of colon: Secondary | ICD-10-CM | POA: Diagnosis not present

## 2015-08-01 DIAGNOSIS — M8580 Other specified disorders of bone density and structure, unspecified site: Secondary | ICD-10-CM | POA: Diagnosis not present

## 2015-08-01 DIAGNOSIS — Z6834 Body mass index (BMI) 34.0-34.9, adult: Secondary | ICD-10-CM | POA: Diagnosis not present

## 2015-08-11 ENCOUNTER — Ambulatory Visit (INDEPENDENT_AMBULATORY_CARE_PROVIDER_SITE_OTHER): Payer: Medicare Other | Admitting: Emergency Medicine

## 2015-08-11 ENCOUNTER — Encounter: Payer: Self-pay | Admitting: Emergency Medicine

## 2015-08-11 VITALS — BP 136/82 | HR 64 | Ht 64.5 in | Wt 196.0 lb

## 2015-08-11 DIAGNOSIS — R05 Cough: Secondary | ICD-10-CM

## 2015-08-11 DIAGNOSIS — R053 Chronic cough: Secondary | ICD-10-CM

## 2015-08-11 NOTE — Patient Instructions (Addendum)
Please restart your loratadine 10mg  in the Fall and the Spring If your reflux begins to become bothersome again then we will start medication for this.  Follow with Dr Lamonte Sakai in 12 months or sooner if you have any problems

## 2015-08-11 NOTE — Progress Notes (Signed)
Subjective:    Patient ID: Jennifer Curry, female    DOB: 1946-02-13, 70 y.o.   MRN: [id number]  Cough Pertinent negatives include no ear pain, eye redness, fever, headaches, postnasal drip, rash, rhinorrhea, sore throat, shortness of breath or wheezing.   70 yo never smoker with history of allergic rhinitis, followed at [facility]. Referred for dyspnea and long standing cough / bronchitis symptoms. She tells me that she was last well this Summer. She was coughing severely in the Spring. Cough is often non-prod. She wakes up most am with cough. Very little nasal gtt. She sometimes feels that she is congested. Can often be associated with dyspnea. Can be associated with chest heaviness, chest pain.  She has been rx with BD's before, may help her, open her up. Cigarette smoke is a trigger.  Seems to be the worst in the Fall.  Currently being treated with azithro and pred x 10 days. This is helping her. She does not perceive heartburn. She uses Hall's cough drops.   ROV 01/24/15 -- follow-up visit for chronic cough. She underwent full pulmonary function testing today Which I have personally reviewed. This shows normal airflows without any significant bronchodilator responsiveness, borderline hyperinflated volumes (doesn't meet official criteria) and a decreased diffusion capacity that corrects to the normal range for alveolar volume. Her flow-volume loops show significant reproducible variability of the inspiratory loops consistent with VCD. At last visit we started pantoprazole, loratadine. Now off the pantoprazole, still own the  She was completing pred / azithro at the time. Returns reporting that she feels better than last time. She has been been working on losing weight on a diet, has lost ~ 30 lbs! Avoiding potential allergens.   ROV 08/08/17 -- patient has a history of chronic cough with some suggestion of variable upper airway irritability on her spirometry.  She has used her loratadine  during spring months, currently off. She avoids allergen exposure. She is off PPI right now as well.    Review of Systems  Constitutional: Negative for fever and unexpected weight change.  HENT: Negative for congestion, dental problem, ear pain, nosebleeds, postnasal drip, rhinorrhea, sinus pressure, sneezing, sore throat and trouble swallowing.   Eyes: Negative for redness and itching.  Respiratory: Negative for cough, chest tightness, shortness of breath and wheezing.   Cardiovascular: Negative for palpitations and leg swelling.  Gastrointestinal: Negative for nausea and vomiting.  Genitourinary: Negative for dysuria.  Musculoskeletal: Negative for joint swelling.  Skin: Negative for rash.  Neurological: Negative for headaches.  Hematological: Does not bruise/bleed easily.  Psychiatric/Behavioral: Negative for dysphoric mood. The patient is not nervous/anxious.     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 Has been exposed to mold before.  [place] native.        Objective:   Physical Exam Filed Vitals:   08/11/15 0948 08/11/15 0949  BP:  136/82  Pulse:  64  Height: 5' 4.5" (1.638 m)   Weight: 196 lb (88.905 kg)   SpO2:  97%   Gen: Pleasant, well-nourished, in no distress,  normal affect, frequent cough today  ENT: No lesions,  mouth clear,  oropharynx clear, no postnasal drip  Neck: No JVD, no TMG, no carotid bruits  Lungs: No use of accessory muscles, clear without rales or rhonchi  Cardiovascular: RRR, heart sounds normal, no murmur or gallops, no peripheral edema  Musculoskeletal: No deformities, no cyanosis or clubbing  Neuro: alert, non focal  Skin: Warm, no lesions or rashes      Assessment & Plan:  Chronic cough Well compensated at this time. Her allergies and GERD are not active. She continues to work hard on allergen avoidance. We will continue same plan. I've asked her to start her loratadine during the fall and spring months  and whenever she didn't she's going to be exposed to dust based on her job schedule   Baltazar Apo, MD, PhD 08/11/2015, 10:15 AM [facility] [phone] or if no answer [phone]

## 2015-08-11 NOTE — Assessment & Plan Note (Signed)
Well compensated at this time. Her allergies and GERD are not active. She continues to work hard on allergen avoidance. We will continue same plan. I've asked her to start her loratadine during the fall and spring months and whenever she didn't she's going to be exposed to dust based on her job schedule

## 2015-08-19 DIAGNOSIS — A6000 Herpesviral infection of urogenital system, unspecified: Secondary | ICD-10-CM | POA: Diagnosis not present

## 2015-08-19 DIAGNOSIS — N898 Other specified noninflammatory disorders of vagina: Secondary | ICD-10-CM | POA: Diagnosis not present

## 2015-08-19 DIAGNOSIS — F4329 Adjustment disorder with other symptoms: Secondary | ICD-10-CM | POA: Diagnosis not present

## 2015-09-02 DIAGNOSIS — F4329 Adjustment disorder with other symptoms: Secondary | ICD-10-CM | POA: Diagnosis not present

## 2015-09-02 DIAGNOSIS — A6000 Herpesviral infection of urogenital system, unspecified: Secondary | ICD-10-CM | POA: Diagnosis not present

## 2015-09-02 DIAGNOSIS — R030 Elevated blood-pressure reading, without diagnosis of hypertension: Secondary | ICD-10-CM | POA: Diagnosis not present

## 2015-09-23 DIAGNOSIS — E669 Obesity, unspecified: Secondary | ICD-10-CM | POA: Diagnosis not present

## 2015-09-23 DIAGNOSIS — Z6834 Body mass index (BMI) 34.0-34.9, adult: Secondary | ICD-10-CM | POA: Diagnosis not present

## 2015-09-23 DIAGNOSIS — R030 Elevated blood-pressure reading, without diagnosis of hypertension: Secondary | ICD-10-CM | POA: Diagnosis not present

## 2015-09-23 DIAGNOSIS — F4329 Adjustment disorder with other symptoms: Secondary | ICD-10-CM | POA: Diagnosis not present

## 2015-09-23 DIAGNOSIS — E559 Vitamin D deficiency, unspecified: Secondary | ICD-10-CM | POA: Diagnosis not present

## 2015-09-23 DIAGNOSIS — A6000 Herpesviral infection of urogenital system, unspecified: Secondary | ICD-10-CM | POA: Diagnosis not present

## 2015-09-25 ENCOUNTER — Other Ambulatory Visit: Payer: Medicare Other

## 2015-09-25 ENCOUNTER — Other Ambulatory Visit: Payer: Self-pay | Admitting: Family Medicine

## 2015-09-25 ENCOUNTER — Ambulatory Visit
Admission: RE | Admit: 2015-09-25 | Discharge: 2015-09-25 | Disposition: A | Discharge status: Home / Self Care | Payer: Medicare Other | Source: Ambulatory Visit | Attending: Family Medicine | Admitting: Family Medicine

## 2015-09-25 DIAGNOSIS — M62830 Muscle spasm of back: Secondary | ICD-10-CM | POA: Diagnosis not present

## 2015-09-25 DIAGNOSIS — M546 Pain in thoracic spine: Secondary | ICD-10-CM | POA: Diagnosis not present

## 2015-09-25 DIAGNOSIS — M8580 Other specified disorders of bone density and structure, unspecified site: Secondary | ICD-10-CM | POA: Diagnosis not present

## 2015-10-07 DIAGNOSIS — A6000 Herpesviral infection of urogenital system, unspecified: Secondary | ICD-10-CM | POA: Diagnosis not present

## 2015-10-07 DIAGNOSIS — R030 Elevated blood-pressure reading, without diagnosis of hypertension: Secondary | ICD-10-CM | POA: Diagnosis not present

## 2015-10-07 DIAGNOSIS — Z6834 Body mass index (BMI) 34.0-34.9, adult: Secondary | ICD-10-CM | POA: Diagnosis not present

## 2015-10-07 DIAGNOSIS — F4329 Adjustment disorder with other symptoms: Secondary | ICD-10-CM | POA: Diagnosis not present

## 2015-10-07 DIAGNOSIS — E559 Vitamin D deficiency, unspecified: Secondary | ICD-10-CM | POA: Diagnosis not present

## 2015-10-07 DIAGNOSIS — E669 Obesity, unspecified: Secondary | ICD-10-CM | POA: Diagnosis not present

## 2015-11-21 DIAGNOSIS — R030 Elevated blood-pressure reading, without diagnosis of hypertension: Secondary | ICD-10-CM | POA: Diagnosis not present

## 2015-11-21 DIAGNOSIS — R0989 Other specified symptoms and signs involving the circulatory and respiratory systems: Secondary | ICD-10-CM | POA: Diagnosis not present

## 2015-11-21 DIAGNOSIS — J181 Lobar pneumonia, unspecified organism: Secondary | ICD-10-CM | POA: Diagnosis not present

## 2015-11-21 DIAGNOSIS — M8580 Other specified disorders of bone density and structure, unspecified site: Secondary | ICD-10-CM | POA: Diagnosis not present

## 2015-11-21 DIAGNOSIS — R05 Cough: Secondary | ICD-10-CM | POA: Diagnosis not present

## 2015-11-28 DIAGNOSIS — R0989 Other specified symptoms and signs involving the circulatory and respiratory systems: Secondary | ICD-10-CM | POA: Diagnosis not present

## 2015-11-28 DIAGNOSIS — J181 Lobar pneumonia, unspecified organism: Secondary | ICD-10-CM | POA: Diagnosis not present

## 2015-12-01 DIAGNOSIS — R0989 Other specified symptoms and signs involving the circulatory and respiratory systems: Secondary | ICD-10-CM | POA: Diagnosis not present

## 2015-12-01 DIAGNOSIS — J181 Lobar pneumonia, unspecified organism: Secondary | ICD-10-CM | POA: Diagnosis not present

## 2015-12-22 DIAGNOSIS — F4329 Adjustment disorder with other symptoms: Secondary | ICD-10-CM | POA: Diagnosis not present

## 2015-12-22 DIAGNOSIS — G4700 Insomnia, unspecified: Secondary | ICD-10-CM | POA: Diagnosis not present

## 2015-12-22 DIAGNOSIS — R0989 Other specified symptoms and signs involving the circulatory and respiratory systems: Secondary | ICD-10-CM | POA: Diagnosis not present

## 2015-12-22 DIAGNOSIS — J181 Lobar pneumonia, unspecified organism: Secondary | ICD-10-CM | POA: Diagnosis not present

## 2015-12-22 DIAGNOSIS — R5383 Other fatigue: Secondary | ICD-10-CM | POA: Diagnosis not present

## 2015-12-22 DIAGNOSIS — E669 Obesity, unspecified: Secondary | ICD-10-CM | POA: Diagnosis not present

## 2015-12-22 DIAGNOSIS — Z1211 Encounter for screening for malignant neoplasm of colon: Secondary | ICD-10-CM | POA: Diagnosis not present

## 2015-12-22 DIAGNOSIS — R030 Elevated blood-pressure reading, without diagnosis of hypertension: Secondary | ICD-10-CM | POA: Diagnosis not present

## 2015-12-22 DIAGNOSIS — E559 Vitamin D deficiency, unspecified: Secondary | ICD-10-CM | POA: Diagnosis not present

## 2016-01-01 DIAGNOSIS — J029 Acute pharyngitis, unspecified: Secondary | ICD-10-CM | POA: Diagnosis not present

## 2016-01-01 DIAGNOSIS — F4329 Adjustment disorder with other symptoms: Secondary | ICD-10-CM | POA: Diagnosis not present

## 2016-01-01 DIAGNOSIS — R509 Fever, unspecified: Secondary | ICD-10-CM | POA: Diagnosis not present

## 2016-01-01 DIAGNOSIS — R05 Cough: Secondary | ICD-10-CM | POA: Diagnosis not present

## 2016-01-22 DIAGNOSIS — Z6834 Body mass index (BMI) 34.0-34.9, adult: Secondary | ICD-10-CM | POA: Diagnosis not present

## 2016-01-22 DIAGNOSIS — E669 Obesity, unspecified: Secondary | ICD-10-CM | POA: Diagnosis not present

## 2016-01-22 DIAGNOSIS — E559 Vitamin D deficiency, unspecified: Secondary | ICD-10-CM | POA: Diagnosis not present

## 2016-01-22 DIAGNOSIS — R5383 Other fatigue: Secondary | ICD-10-CM | POA: Diagnosis not present

## 2016-01-22 DIAGNOSIS — F4329 Adjustment disorder with other symptoms: Secondary | ICD-10-CM | POA: Diagnosis not present

## 2016-01-22 DIAGNOSIS — R030 Elevated blood-pressure reading, without diagnosis of hypertension: Secondary | ICD-10-CM | POA: Diagnosis not present

## 2016-01-28 DIAGNOSIS — Z1231 Encounter for screening mammogram for malignant neoplasm of breast: Secondary | ICD-10-CM | POA: Diagnosis not present

## 2016-03-05 DIAGNOSIS — E669 Obesity, unspecified: Secondary | ICD-10-CM | POA: Diagnosis not present

## 2016-03-05 DIAGNOSIS — Z23 Encounter for immunization: Secondary | ICD-10-CM | POA: Diagnosis not present

## 2016-03-05 DIAGNOSIS — R030 Elevated blood-pressure reading, without diagnosis of hypertension: Secondary | ICD-10-CM | POA: Diagnosis not present

## 2016-03-05 DIAGNOSIS — Z6835 Body mass index (BMI) 35.0-35.9, adult: Secondary | ICD-10-CM | POA: Diagnosis not present

## 2016-03-05 DIAGNOSIS — R5383 Other fatigue: Secondary | ICD-10-CM | POA: Diagnosis not present

## 2016-03-05 DIAGNOSIS — E559 Vitamin D deficiency, unspecified: Secondary | ICD-10-CM | POA: Diagnosis not present

## 2016-03-05 DIAGNOSIS — F4329 Adjustment disorder with other symptoms: Secondary | ICD-10-CM | POA: Diagnosis not present

## 2016-03-05 DIAGNOSIS — E7800 Pure hypercholesterolemia, unspecified: Secondary | ICD-10-CM | POA: Diagnosis not present

## 2016-04-02 DIAGNOSIS — R030 Elevated blood-pressure reading, without diagnosis of hypertension: Secondary | ICD-10-CM | POA: Diagnosis not present

## 2016-04-02 DIAGNOSIS — F4329 Adjustment disorder with other symptoms: Secondary | ICD-10-CM | POA: Diagnosis not present

## 2016-04-02 DIAGNOSIS — R5383 Other fatigue: Secondary | ICD-10-CM | POA: Diagnosis not present

## 2016-04-02 DIAGNOSIS — E7800 Pure hypercholesterolemia, unspecified: Secondary | ICD-10-CM | POA: Diagnosis not present

## 2016-04-02 DIAGNOSIS — E669 Obesity, unspecified: Secondary | ICD-10-CM | POA: Diagnosis not present

## 2016-04-02 DIAGNOSIS — E559 Vitamin D deficiency, unspecified: Secondary | ICD-10-CM | POA: Diagnosis not present

## 2016-04-30 DIAGNOSIS — F4329 Adjustment disorder with other symptoms: Secondary | ICD-10-CM | POA: Diagnosis not present

## 2016-04-30 DIAGNOSIS — R030 Elevated blood-pressure reading, without diagnosis of hypertension: Secondary | ICD-10-CM | POA: Diagnosis not present

## 2016-04-30 DIAGNOSIS — Z6834 Body mass index (BMI) 34.0-34.9, adult: Secondary | ICD-10-CM | POA: Diagnosis not present

## 2016-04-30 DIAGNOSIS — R5383 Other fatigue: Secondary | ICD-10-CM | POA: Diagnosis not present

## 2016-04-30 DIAGNOSIS — E559 Vitamin D deficiency, unspecified: Secondary | ICD-10-CM | POA: Diagnosis not present

## 2016-04-30 DIAGNOSIS — E7800 Pure hypercholesterolemia, unspecified: Secondary | ICD-10-CM | POA: Diagnosis not present

## 2016-04-30 DIAGNOSIS — E669 Obesity, unspecified: Secondary | ICD-10-CM | POA: Diagnosis not present

## 2016-05-27 DIAGNOSIS — F4329 Adjustment disorder with other symptoms: Secondary | ICD-10-CM | POA: Diagnosis not present

## 2016-05-27 DIAGNOSIS — R600 Localized edema: Secondary | ICD-10-CM | POA: Diagnosis not present

## 2016-05-27 DIAGNOSIS — E7800 Pure hypercholesterolemia, unspecified: Secondary | ICD-10-CM | POA: Diagnosis not present

## 2016-05-27 DIAGNOSIS — I10 Essential (primary) hypertension: Secondary | ICD-10-CM | POA: Diagnosis not present

## 2016-05-27 DIAGNOSIS — Z6834 Body mass index (BMI) 34.0-34.9, adult: Secondary | ICD-10-CM | POA: Diagnosis not present

## 2016-05-27 DIAGNOSIS — E669 Obesity, unspecified: Secondary | ICD-10-CM | POA: Diagnosis not present

## 2016-05-27 DIAGNOSIS — E559 Vitamin D deficiency, unspecified: Secondary | ICD-10-CM | POA: Diagnosis not present

## 2016-05-27 DIAGNOSIS — R5383 Other fatigue: Secondary | ICD-10-CM | POA: Diagnosis not present

## 2016-06-07 DIAGNOSIS — S39012A Strain of muscle, fascia and tendon of lower back, initial encounter: Secondary | ICD-10-CM | POA: Diagnosis not present

## 2016-06-07 DIAGNOSIS — K5900 Constipation, unspecified: Secondary | ICD-10-CM | POA: Diagnosis not present

## 2016-06-07 DIAGNOSIS — S161XXA Strain of muscle, fascia and tendon at neck level, initial encounter: Secondary | ICD-10-CM | POA: Diagnosis not present

## 2016-08-12 DIAGNOSIS — R600 Localized edema: Secondary | ICD-10-CM | POA: Diagnosis not present

## 2016-08-12 DIAGNOSIS — I10 Essential (primary) hypertension: Secondary | ICD-10-CM | POA: Diagnosis not present

## 2016-08-12 DIAGNOSIS — E669 Obesity, unspecified: Secondary | ICD-10-CM | POA: Diagnosis not present

## 2016-08-12 DIAGNOSIS — R5383 Other fatigue: Secondary | ICD-10-CM | POA: Diagnosis not present

## 2016-08-12 DIAGNOSIS — E7800 Pure hypercholesterolemia, unspecified: Secondary | ICD-10-CM | POA: Diagnosis not present

## 2016-08-12 DIAGNOSIS — F4329 Adjustment disorder with other symptoms: Secondary | ICD-10-CM | POA: Diagnosis not present

## 2016-08-12 DIAGNOSIS — E559 Vitamin D deficiency, unspecified: Secondary | ICD-10-CM | POA: Diagnosis not present

## 2016-08-12 DIAGNOSIS — Z6834 Body mass index (BMI) 34.0-34.9, adult: Secondary | ICD-10-CM | POA: Diagnosis not present

## 2016-09-21 DIAGNOSIS — E559 Vitamin D deficiency, unspecified: Secondary | ICD-10-CM | POA: Diagnosis not present

## 2016-09-21 DIAGNOSIS — R5383 Other fatigue: Secondary | ICD-10-CM | POA: Diagnosis not present

## 2016-09-21 DIAGNOSIS — E669 Obesity, unspecified: Secondary | ICD-10-CM | POA: Diagnosis not present

## 2016-09-21 DIAGNOSIS — I10 Essential (primary) hypertension: Secondary | ICD-10-CM | POA: Diagnosis not present

## 2016-09-21 DIAGNOSIS — E7800 Pure hypercholesterolemia, unspecified: Secondary | ICD-10-CM | POA: Diagnosis not present

## 2016-09-21 DIAGNOSIS — Z6834 Body mass index (BMI) 34.0-34.9, adult: Secondary | ICD-10-CM | POA: Diagnosis not present

## 2016-09-21 DIAGNOSIS — F4329 Adjustment disorder with other symptoms: Secondary | ICD-10-CM | POA: Diagnosis not present

## 2016-09-21 DIAGNOSIS — R600 Localized edema: Secondary | ICD-10-CM | POA: Diagnosis not present

## 2016-11-23 DIAGNOSIS — R5383 Other fatigue: Secondary | ICD-10-CM | POA: Diagnosis not present

## 2016-11-23 DIAGNOSIS — F4329 Adjustment disorder with other symptoms: Secondary | ICD-10-CM | POA: Diagnosis not present

## 2016-11-23 DIAGNOSIS — I10 Essential (primary) hypertension: Secondary | ICD-10-CM | POA: Diagnosis not present

## 2016-11-23 DIAGNOSIS — E7800 Pure hypercholesterolemia, unspecified: Secondary | ICD-10-CM | POA: Diagnosis not present

## 2016-11-23 DIAGNOSIS — Z1211 Encounter for screening for malignant neoplasm of colon: Secondary | ICD-10-CM | POA: Diagnosis not present

## 2016-11-23 DIAGNOSIS — R600 Localized edema: Secondary | ICD-10-CM | POA: Diagnosis not present

## 2016-11-23 DIAGNOSIS — Z6834 Body mass index (BMI) 34.0-34.9, adult: Secondary | ICD-10-CM | POA: Diagnosis not present

## 2016-11-23 DIAGNOSIS — E669 Obesity, unspecified: Secondary | ICD-10-CM | POA: Diagnosis not present

## 2016-11-23 DIAGNOSIS — E559 Vitamin D deficiency, unspecified: Secondary | ICD-10-CM | POA: Diagnosis not present

## 2017-01-17 DIAGNOSIS — J209 Acute bronchitis, unspecified: Secondary | ICD-10-CM | POA: Diagnosis not present

## 2017-03-08 DIAGNOSIS — J209 Acute bronchitis, unspecified: Secondary | ICD-10-CM | POA: Diagnosis not present

## 2017-03-18 DIAGNOSIS — J209 Acute bronchitis, unspecified: Secondary | ICD-10-CM | POA: Diagnosis not present

## 2017-03-18 DIAGNOSIS — R062 Wheezing: Secondary | ICD-10-CM | POA: Diagnosis not present

## 2017-04-01 DIAGNOSIS — J209 Acute bronchitis, unspecified: Secondary | ICD-10-CM | POA: Diagnosis not present

## 2017-04-01 DIAGNOSIS — R062 Wheezing: Secondary | ICD-10-CM | POA: Diagnosis not present

## 2017-05-10 DIAGNOSIS — I10 Essential (primary) hypertension: Secondary | ICD-10-CM | POA: Diagnosis not present

## 2017-05-10 DIAGNOSIS — E669 Obesity, unspecified: Secondary | ICD-10-CM | POA: Diagnosis not present

## 2017-05-10 DIAGNOSIS — R062 Wheezing: Secondary | ICD-10-CM | POA: Diagnosis not present

## 2017-05-10 DIAGNOSIS — R05 Cough: Secondary | ICD-10-CM | POA: Diagnosis not present

## 2017-05-10 DIAGNOSIS — E559 Vitamin D deficiency, unspecified: Secondary | ICD-10-CM | POA: Diagnosis not present

## 2017-05-10 DIAGNOSIS — E7800 Pure hypercholesterolemia, unspecified: Secondary | ICD-10-CM | POA: Diagnosis not present

## 2017-05-10 DIAGNOSIS — F4329 Adjustment disorder with other symptoms: Secondary | ICD-10-CM | POA: Diagnosis not present

## 2017-05-10 DIAGNOSIS — Z6834 Body mass index (BMI) 34.0-34.9, adult: Secondary | ICD-10-CM | POA: Diagnosis not present

## 2017-06-06 DIAGNOSIS — R509 Fever, unspecified: Secondary | ICD-10-CM | POA: Diagnosis not present

## 2017-06-06 DIAGNOSIS — J181 Lobar pneumonia, unspecified organism: Secondary | ICD-10-CM | POA: Diagnosis not present

## 2017-06-06 DIAGNOSIS — R0989 Other specified symptoms and signs involving the circulatory and respiratory systems: Secondary | ICD-10-CM | POA: Diagnosis not present

## 2017-06-09 DIAGNOSIS — R0989 Other specified symptoms and signs involving the circulatory and respiratory systems: Secondary | ICD-10-CM | POA: Diagnosis not present

## 2017-06-09 DIAGNOSIS — J181 Lobar pneumonia, unspecified organism: Secondary | ICD-10-CM | POA: Diagnosis not present

## 2017-06-09 DIAGNOSIS — R030 Elevated blood-pressure reading, without diagnosis of hypertension: Secondary | ICD-10-CM | POA: Diagnosis not present

## 2017-06-09 DIAGNOSIS — R509 Fever, unspecified: Secondary | ICD-10-CM | POA: Diagnosis not present

## 2017-07-12 DIAGNOSIS — R062 Wheezing: Secondary | ICD-10-CM | POA: Diagnosis not present

## 2017-07-12 DIAGNOSIS — E7800 Pure hypercholesterolemia, unspecified: Secondary | ICD-10-CM | POA: Diagnosis not present

## 2017-07-12 DIAGNOSIS — E669 Obesity, unspecified: Secondary | ICD-10-CM | POA: Diagnosis not present

## 2017-07-12 DIAGNOSIS — F4329 Adjustment disorder with other symptoms: Secondary | ICD-10-CM | POA: Diagnosis not present

## 2017-07-12 DIAGNOSIS — E559 Vitamin D deficiency, unspecified: Secondary | ICD-10-CM | POA: Diagnosis not present

## 2017-07-12 DIAGNOSIS — Z6833 Body mass index (BMI) 33.0-33.9, adult: Secondary | ICD-10-CM | POA: Diagnosis not present

## 2017-07-12 DIAGNOSIS — I10 Essential (primary) hypertension: Secondary | ICD-10-CM | POA: Diagnosis not present

## 2017-10-11 DIAGNOSIS — F4329 Adjustment disorder with other symptoms: Secondary | ICD-10-CM | POA: Diagnosis not present

## 2017-10-11 DIAGNOSIS — I10 Essential (primary) hypertension: Secondary | ICD-10-CM | POA: Diagnosis not present

## 2017-10-11 DIAGNOSIS — E669 Obesity, unspecified: Secondary | ICD-10-CM | POA: Diagnosis not present

## 2017-10-11 DIAGNOSIS — Z1211 Encounter for screening for malignant neoplasm of colon: Secondary | ICD-10-CM | POA: Diagnosis not present

## 2017-10-11 DIAGNOSIS — E559 Vitamin D deficiency, unspecified: Secondary | ICD-10-CM | POA: Diagnosis not present

## 2017-10-11 DIAGNOSIS — R062 Wheezing: Secondary | ICD-10-CM | POA: Diagnosis not present

## 2017-10-11 DIAGNOSIS — E7800 Pure hypercholesterolemia, unspecified: Secondary | ICD-10-CM | POA: Diagnosis not present

## 2017-12-26 DIAGNOSIS — J209 Acute bronchitis, unspecified: Secondary | ICD-10-CM | POA: Diagnosis not present

## 2017-12-26 DIAGNOSIS — E669 Obesity, unspecified: Secondary | ICD-10-CM | POA: Diagnosis not present

## 2017-12-26 DIAGNOSIS — E559 Vitamin D deficiency, unspecified: Secondary | ICD-10-CM | POA: Diagnosis not present

## 2017-12-26 DIAGNOSIS — I10 Essential (primary) hypertension: Secondary | ICD-10-CM | POA: Diagnosis not present

## 2017-12-26 DIAGNOSIS — E7800 Pure hypercholesterolemia, unspecified: Secondary | ICD-10-CM | POA: Diagnosis not present

## 2017-12-26 DIAGNOSIS — Z6833 Body mass index (BMI) 33.0-33.9, adult: Secondary | ICD-10-CM | POA: Diagnosis not present

## 2018-02-06 DIAGNOSIS — E7800 Pure hypercholesterolemia, unspecified: Secondary | ICD-10-CM | POA: Diagnosis not present

## 2018-02-06 DIAGNOSIS — I10 Essential (primary) hypertension: Secondary | ICD-10-CM | POA: Diagnosis not present

## 2018-02-06 DIAGNOSIS — F439 Reaction to severe stress, unspecified: Secondary | ICD-10-CM | POA: Diagnosis not present

## 2018-02-06 DIAGNOSIS — E669 Obesity, unspecified: Secondary | ICD-10-CM | POA: Diagnosis not present

## 2018-02-06 DIAGNOSIS — E559 Vitamin D deficiency, unspecified: Secondary | ICD-10-CM | POA: Diagnosis not present

## 2018-03-23 DIAGNOSIS — I10 Essential (primary) hypertension: Secondary | ICD-10-CM | POA: Diagnosis not present

## 2018-03-23 DIAGNOSIS — E669 Obesity, unspecified: Secondary | ICD-10-CM | POA: Diagnosis not present

## 2018-03-23 DIAGNOSIS — E559 Vitamin D deficiency, unspecified: Secondary | ICD-10-CM | POA: Diagnosis not present

## 2018-03-23 DIAGNOSIS — A6000 Herpesviral infection of urogenital system, unspecified: Secondary | ICD-10-CM | POA: Diagnosis not present

## 2018-03-23 DIAGNOSIS — F439 Reaction to severe stress, unspecified: Secondary | ICD-10-CM | POA: Diagnosis not present

## 2018-03-23 DIAGNOSIS — E7800 Pure hypercholesterolemia, unspecified: Secondary | ICD-10-CM | POA: Diagnosis not present

## 2018-03-27 DIAGNOSIS — L309 Dermatitis, unspecified: Secondary | ICD-10-CM | POA: Diagnosis not present

## 2018-03-30 DIAGNOSIS — B850 Pediculosis due to Pediculus humanus capitis: Secondary | ICD-10-CM | POA: Diagnosis not present

## 2018-12-28 DIAGNOSIS — R062 Wheezing: Secondary | ICD-10-CM | POA: Diagnosis not present

## 2018-12-28 DIAGNOSIS — J209 Acute bronchitis, unspecified: Secondary | ICD-10-CM | POA: Diagnosis not present

## 2018-12-29 ENCOUNTER — Other Ambulatory Visit: Payer: Self-pay

## 2018-12-29 DIAGNOSIS — Z20822 Contact with and (suspected) exposure to covid-19: Secondary | ICD-10-CM

## 2018-12-30 LAB — NOVEL CORONAVIRUS, NAA: SARS-CoV-2, NAA: NOT DETECTED

## 2019-01-01 DIAGNOSIS — R062 Wheezing: Secondary | ICD-10-CM | POA: Diagnosis not present

## 2019-01-01 DIAGNOSIS — J209 Acute bronchitis, unspecified: Secondary | ICD-10-CM | POA: Diagnosis not present

## 2019-01-08 DIAGNOSIS — J209 Acute bronchitis, unspecified: Secondary | ICD-10-CM | POA: Diagnosis not present

## 2019-01-08 DIAGNOSIS — R062 Wheezing: Secondary | ICD-10-CM | POA: Diagnosis not present

## 2019-01-12 DIAGNOSIS — R062 Wheezing: Secondary | ICD-10-CM | POA: Diagnosis not present

## 2019-01-12 DIAGNOSIS — J209 Acute bronchitis, unspecified: Secondary | ICD-10-CM | POA: Diagnosis not present

## 2020-02-05 DIAGNOSIS — Z6835 Body mass index (BMI) 35.0-35.9, adult: Secondary | ICD-10-CM | POA: Diagnosis not present

## 2020-02-05 DIAGNOSIS — E559 Vitamin D deficiency, unspecified: Secondary | ICD-10-CM | POA: Diagnosis not present

## 2020-02-05 DIAGNOSIS — M8580 Other specified disorders of bone density and structure, unspecified site: Secondary | ICD-10-CM | POA: Diagnosis not present

## 2020-02-05 DIAGNOSIS — F4329 Adjustment disorder with other symptoms: Secondary | ICD-10-CM | POA: Diagnosis not present

## 2020-02-05 DIAGNOSIS — Z1211 Encounter for screening for malignant neoplasm of colon: Secondary | ICD-10-CM | POA: Diagnosis not present

## 2020-02-05 DIAGNOSIS — A6000 Herpesviral infection of urogenital system, unspecified: Secondary | ICD-10-CM | POA: Diagnosis not present

## 2020-02-05 DIAGNOSIS — R030 Elevated blood-pressure reading, without diagnosis of hypertension: Secondary | ICD-10-CM | POA: Diagnosis not present

## 2020-02-05 DIAGNOSIS — I10 Essential (primary) hypertension: Secondary | ICD-10-CM | POA: Diagnosis not present

## 2020-02-05 DIAGNOSIS — Z1159 Encounter for screening for other viral diseases: Secondary | ICD-10-CM | POA: Diagnosis not present

## 2020-02-05 DIAGNOSIS — E7800 Pure hypercholesterolemia, unspecified: Secondary | ICD-10-CM | POA: Diagnosis not present

## 2020-05-08 DIAGNOSIS — E559 Vitamin D deficiency, unspecified: Secondary | ICD-10-CM | POA: Diagnosis not present

## 2020-05-08 DIAGNOSIS — E7800 Pure hypercholesterolemia, unspecified: Secondary | ICD-10-CM | POA: Diagnosis not present

## 2020-05-08 DIAGNOSIS — F4329 Adjustment disorder with other symptoms: Secondary | ICD-10-CM | POA: Diagnosis not present

## 2020-05-08 DIAGNOSIS — M8580 Other specified disorders of bone density and structure, unspecified site: Secondary | ICD-10-CM | POA: Diagnosis not present

## 2020-05-08 DIAGNOSIS — Z6835 Body mass index (BMI) 35.0-35.9, adult: Secondary | ICD-10-CM | POA: Diagnosis not present

## 2020-05-08 DIAGNOSIS — I10 Essential (primary) hypertension: Secondary | ICD-10-CM | POA: Diagnosis not present

## 2020-05-19 ENCOUNTER — Emergency Department (HOSPITAL_COMMUNITY)
Admission: EM | Admit: 2020-05-19 | Discharge: 2020-05-19 | Disposition: A | Discharge status: Home / Self Care | Payer: Medicare Other | Attending: Emergency Medicine | Admitting: Emergency Medicine

## 2020-05-19 ENCOUNTER — Emergency Department (HOSPITAL_COMMUNITY): Payer: Medicare Other

## 2020-05-19 ENCOUNTER — Encounter (HOSPITAL_COMMUNITY): Payer: Self-pay | Admitting: *Deleted

## 2020-05-19 DIAGNOSIS — S0990XA Unspecified injury of head, initial encounter: Secondary | ICD-10-CM | POA: Insufficient documentation

## 2020-05-19 DIAGNOSIS — S8992XA Unspecified injury of left lower leg, initial encounter: Secondary | ICD-10-CM | POA: Diagnosis not present

## 2020-05-19 DIAGNOSIS — Z79899 Other long term (current) drug therapy: Secondary | ICD-10-CM | POA: Diagnosis not present

## 2020-05-19 DIAGNOSIS — S82401A Unspecified fracture of shaft of right fibula, initial encounter for closed fracture: Secondary | ICD-10-CM | POA: Diagnosis not present

## 2020-05-19 DIAGNOSIS — Y9301 Activity, walking, marching and hiking: Secondary | ICD-10-CM | POA: Insufficient documentation

## 2020-05-19 DIAGNOSIS — I10 Essential (primary) hypertension: Secondary | ICD-10-CM | POA: Diagnosis not present

## 2020-05-19 DIAGNOSIS — M25571 Pain in right ankle and joints of right foot: Secondary | ICD-10-CM | POA: Diagnosis not present

## 2020-05-19 DIAGNOSIS — S82832A Other fracture of upper and lower end of left fibula, initial encounter for closed fracture: Secondary | ICD-10-CM | POA: Diagnosis not present

## 2020-05-19 DIAGNOSIS — S99922A Unspecified injury of left foot, initial encounter: Secondary | ICD-10-CM | POA: Diagnosis not present

## 2020-05-19 DIAGNOSIS — W108XXA Fall (on) (from) other stairs and steps, initial encounter: Secondary | ICD-10-CM | POA: Insufficient documentation

## 2020-05-19 DIAGNOSIS — S82831A Other fracture of upper and lower end of right fibula, initial encounter for closed fracture: Secondary | ICD-10-CM | POA: Insufficient documentation

## 2020-05-19 DIAGNOSIS — S99911A Unspecified injury of right ankle, initial encounter: Secondary | ICD-10-CM | POA: Diagnosis present

## 2020-05-19 DIAGNOSIS — S82301A Unspecified fracture of lower end of right tibia, initial encounter for closed fracture: Secondary | ICD-10-CM | POA: Diagnosis not present

## 2020-05-19 DIAGNOSIS — S82201A Unspecified fracture of shaft of right tibia, initial encounter for closed fracture: Secondary | ICD-10-CM

## 2020-05-19 DIAGNOSIS — S6991XA Unspecified injury of right wrist, hand and finger(s), initial encounter: Secondary | ICD-10-CM | POA: Diagnosis not present

## 2020-05-19 HISTORY — DX: Essential (primary) hypertension: I10

## 2020-05-19 MED ORDER — NAPROXEN 500 MG PO TABS: 500.0000 mg | ORAL_TABLET | Freq: Two times a day (BID) | ORAL | 0 refills | Status: AC

## 2020-05-19 NOTE — ED Triage Notes (Signed)
Bilateral ankle pain, fell in a hole
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
## 2020-05-19 NOTE — Discharge Instructions (Addendum)
Given the location of your fracture this will probably not require surgery but Dr. Aline Brochure is the specialist, I would recommend that you follow-up with him and he will be able to guide you through your treatment plan Your x-ray shows that you have a small fracture at the bottom of your right ankle, you do not have any broken bones on your left leg or your right wrist and your brain and skull look good on the CAT scan without any bleeding strokes tumors or fractures You may take naproxen twice a day as needed for pain, keep your right foot elevated with an ice pack in the splint until you follow-up with Dr. Karsten Fells may return to the emergency department for any severe or worsening symptoms.

## 2020-05-29 DIAGNOSIS — S93492A Sprain of other ligament of left ankle, initial encounter: Secondary | ICD-10-CM | POA: Diagnosis not present

## 2020-05-29 DIAGNOSIS — S82831A Other fracture of upper and lower end of right fibula, initial encounter for closed fracture: Secondary | ICD-10-CM | POA: Diagnosis not present

## 2020-06-13 DIAGNOSIS — S82831D Other fracture of upper and lower end of right fibula, subsequent encounter for closed fracture with routine healing: Secondary | ICD-10-CM | POA: Diagnosis not present

## 2020-06-13 DIAGNOSIS — S93492D Sprain of other ligament of left ankle, subsequent encounter: Secondary | ICD-10-CM | POA: Diagnosis not present

## 2020-06-30 DIAGNOSIS — M25571 Pain in right ankle and joints of right foot: Secondary | ICD-10-CM | POA: Diagnosis not present

## 2020-06-30 DIAGNOSIS — S93402D Sprain of unspecified ligament of left ankle, subsequent encounter: Secondary | ICD-10-CM | POA: Diagnosis not present

## 2020-07-02 DIAGNOSIS — S93402D Sprain of unspecified ligament of left ankle, subsequent encounter: Secondary | ICD-10-CM | POA: Diagnosis not present

## 2020-07-02 DIAGNOSIS — M25571 Pain in right ankle and joints of right foot: Secondary | ICD-10-CM | POA: Diagnosis not present

## 2020-07-04 ENCOUNTER — Other Ambulatory Visit (HOSPITAL_COMMUNITY): Payer: Self-pay | Admitting: Physician Assistant

## 2020-07-04 ENCOUNTER — Other Ambulatory Visit: Payer: Self-pay

## 2020-07-04 ENCOUNTER — Ambulatory Visit (HOSPITAL_COMMUNITY)
Admission: RE | Admit: 2020-07-04 | Discharge: 2020-07-04 | Disposition: A | Discharge status: Home / Self Care | Payer: Medicare Other | Source: Ambulatory Visit | Attending: Physician Assistant | Admitting: Physician Assistant

## 2020-07-04 DIAGNOSIS — M7989 Other specified soft tissue disorders: Secondary | ICD-10-CM

## 2020-07-04 DIAGNOSIS — M79605 Pain in left leg: Secondary | ICD-10-CM | POA: Diagnosis not present

## 2020-07-04 DIAGNOSIS — M79662 Pain in left lower leg: Secondary | ICD-10-CM | POA: Diagnosis not present

## 2020-07-04 DIAGNOSIS — S82891D Other fracture of right lower leg, subsequent encounter for closed fracture with routine healing: Secondary | ICD-10-CM | POA: Diagnosis not present

## 2020-07-04 DIAGNOSIS — S93492D Sprain of other ligament of left ankle, subsequent encounter: Secondary | ICD-10-CM | POA: Diagnosis not present

## 2020-07-07 DIAGNOSIS — S93402D Sprain of unspecified ligament of left ankle, subsequent encounter: Secondary | ICD-10-CM | POA: Diagnosis not present

## 2020-07-07 DIAGNOSIS — M25571 Pain in right ankle and joints of right foot: Secondary | ICD-10-CM | POA: Diagnosis not present

## 2020-07-09 DIAGNOSIS — S93402D Sprain of unspecified ligament of left ankle, subsequent encounter: Secondary | ICD-10-CM | POA: Diagnosis not present

## 2020-07-09 DIAGNOSIS — M25571 Pain in right ankle and joints of right foot: Secondary | ICD-10-CM | POA: Diagnosis not present

## 2020-07-14 DIAGNOSIS — M25571 Pain in right ankle and joints of right foot: Secondary | ICD-10-CM | POA: Diagnosis not present

## 2020-07-14 DIAGNOSIS — S93402D Sprain of unspecified ligament of left ankle, subsequent encounter: Secondary | ICD-10-CM | POA: Diagnosis not present

## 2020-07-17 DIAGNOSIS — Z20822 Contact with and (suspected) exposure to covid-19: Secondary | ICD-10-CM | POA: Diagnosis not present

## 2020-07-17 DIAGNOSIS — Z03818 Encounter for observation for suspected exposure to other biological agents ruled out: Secondary | ICD-10-CM | POA: Diagnosis not present

## 2020-07-17 DIAGNOSIS — R0989 Other specified symptoms and signs involving the circulatory and respiratory systems: Secondary | ICD-10-CM | POA: Diagnosis not present

## 2020-07-17 DIAGNOSIS — R059 Cough, unspecified: Secondary | ICD-10-CM | POA: Diagnosis not present

## 2020-08-15 DIAGNOSIS — S82891S Other fracture of right lower leg, sequela: Secondary | ICD-10-CM | POA: Diagnosis not present

## 2020-08-15 DIAGNOSIS — S93402D Sprain of unspecified ligament of left ankle, subsequent encounter: Secondary | ICD-10-CM | POA: Diagnosis not present

## 2020-09-03 DIAGNOSIS — Z20828 Contact with and (suspected) exposure to other viral communicable diseases: Secondary | ICD-10-CM | POA: Diagnosis not present

## 2020-09-03 DIAGNOSIS — U071 COVID-19: Secondary | ICD-10-CM | POA: Diagnosis not present

## 2020-09-29 DIAGNOSIS — F4329 Adjustment disorder with other symptoms: Secondary | ICD-10-CM | POA: Diagnosis not present

## 2020-09-29 DIAGNOSIS — E7800 Pure hypercholesterolemia, unspecified: Secondary | ICD-10-CM | POA: Diagnosis not present

## 2020-09-29 DIAGNOSIS — U071 COVID-19: Secondary | ICD-10-CM | POA: Diagnosis not present

## 2020-09-29 DIAGNOSIS — I10 Essential (primary) hypertension: Secondary | ICD-10-CM | POA: Diagnosis not present

## 2020-09-29 DIAGNOSIS — E559 Vitamin D deficiency, unspecified: Secondary | ICD-10-CM | POA: Diagnosis not present

## 2020-09-29 DIAGNOSIS — M8580 Other specified disorders of bone density and structure, unspecified site: Secondary | ICD-10-CM | POA: Diagnosis not present

## 2020-09-29 DIAGNOSIS — Z6835 Body mass index (BMI) 35.0-35.9, adult: Secondary | ICD-10-CM | POA: Diagnosis not present

## 2020-09-29 DIAGNOSIS — R600 Localized edema: Secondary | ICD-10-CM | POA: Diagnosis not present

## 2021-02-12 DIAGNOSIS — J069 Acute upper respiratory infection, unspecified: Secondary | ICD-10-CM | POA: Diagnosis not present

## 2021-02-12 DIAGNOSIS — J209 Acute bronchitis, unspecified: Secondary | ICD-10-CM | POA: Diagnosis not present

## 2021-02-12 DIAGNOSIS — R5383 Other fatigue: Secondary | ICD-10-CM | POA: Diagnosis not present

## 2021-02-12 DIAGNOSIS — Z03818 Encounter for observation for suspected exposure to other biological agents ruled out: Secondary | ICD-10-CM | POA: Diagnosis not present

## 2021-02-12 DIAGNOSIS — R42 Dizziness and giddiness: Secondary | ICD-10-CM | POA: Diagnosis not present

## 2021-02-17 ENCOUNTER — Emergency Department (HOSPITAL_COMMUNITY)
Admission: EM | Admit: 2021-02-17 | Discharge: 2021-02-18 | Disposition: A | Discharge status: Home / Self Care | Payer: Medicare Other | Attending: Emergency Medicine | Admitting: Emergency Medicine

## 2021-02-17 ENCOUNTER — Other Ambulatory Visit: Payer: Self-pay

## 2021-02-17 ENCOUNTER — Emergency Department (HOSPITAL_COMMUNITY): Payer: Medicare Other

## 2021-02-17 ENCOUNTER — Encounter (HOSPITAL_COMMUNITY): Payer: Self-pay | Admitting: Emergency Medicine

## 2021-02-17 DIAGNOSIS — R600 Localized edema: Secondary | ICD-10-CM | POA: Insufficient documentation

## 2021-02-17 DIAGNOSIS — M79605 Pain in left leg: Secondary | ICD-10-CM | POA: Diagnosis not present

## 2021-02-17 DIAGNOSIS — M7989 Other specified soft tissue disorders: Secondary | ICD-10-CM

## 2021-02-17 DIAGNOSIS — B349 Viral infection, unspecified: Secondary | ICD-10-CM | POA: Diagnosis not present

## 2021-02-17 DIAGNOSIS — R0602 Shortness of breath: Secondary | ICD-10-CM | POA: Diagnosis not present

## 2021-02-17 DIAGNOSIS — Z79899 Other long term (current) drug therapy: Secondary | ICD-10-CM | POA: Diagnosis not present

## 2021-02-17 DIAGNOSIS — I10 Essential (primary) hypertension: Secondary | ICD-10-CM | POA: Insufficient documentation

## 2021-02-17 LAB — CBC WITH DIFFERENTIAL/PLATELET
Abs Immature Granulocytes: 0.07 10*3/uL (ref 0.00–0.07)
Basophils Absolute: 0 10*3/uL (ref 0.0–0.1)
Basophils Relative: 0 %
Eosinophils Absolute: 0 10*3/uL (ref 0.0–0.5)
Eosinophils Relative: 0 %
HCT: 38.5 % (ref 36.0–46.0)
Hemoglobin: 12.8 g/dL (ref 12.0–15.0)
Immature Granulocytes: 1 %
Lymphocytes Relative: 16 %
Lymphs Abs: 1 10*3/uL (ref 0.7–4.0)
MCH: 31.6 pg (ref 26.0–34.0)
MCHC: 33.2 g/dL (ref 30.0–36.0)
MCV: 95.1 fL (ref 80.0–100.0)
Monocytes Absolute: 0.2 10*3/uL (ref 0.1–1.0)
Monocytes Relative: 4 %
Neutro Abs: 4.9 10*3/uL (ref 1.7–7.7)
Neutrophils Relative %: 79 %
Platelets: 215 10*3/uL (ref 150–400)
RBC: 4.05 MIL/uL (ref 3.87–5.11)
RDW: 13.2 % (ref 11.5–15.5)
WBC: 6.2 10*3/uL (ref 4.0–10.5)
nRBC: 0 % (ref 0.0–0.2)

## 2021-02-17 LAB — BASIC METABOLIC PANEL
Anion gap: 9 (ref 5–15)
BUN: 22 mg/dL (ref 8–23)
CO2: 26 mmol/L (ref 22–32)
Calcium: 8.4 mg/dL — ABNORMAL LOW (ref 8.9–10.3)
Chloride: 104 mmol/L (ref 98–111)
Creatinine, Ser: 0.83 mg/dL (ref 0.44–1.00)
GFR, Estimated: >60 mL/min (ref >60)
Glucose, Bld: 248 mg/dL — ABNORMAL HIGH (ref 70–99)
Potassium: 3.8 mmol/L (ref 3.5–5.1)
Sodium: 139 mmol/L (ref 135–145)

## 2021-02-17 LAB — TROPONIN I (HIGH SENSITIVITY): Troponin I (High Sensitivity): 4 ng/L (ref <18)

## 2021-02-17 LAB — BRAIN NATRIURETIC PEPTIDE: B Natriuretic Peptide: 177.4 pg/mL — ABNORMAL HIGH (ref 0.0–100.0)

## 2021-02-17 NOTE — ED Provider Notes (Signed)
Emergency Medicine Provider Triage Evaluation Note  Jennifer Curry , a 75 y.o. female  was evaluated in triage.  Pt complains of 1 week of progressively worsening shortness of breath diagnosed with bronchitis in the outpatient setting, not improving with prednisone.  Additionally has bilateral lower extremity edema with left calf pain and severe tenderness to palpation.  Directed to the ED by PCP.Marland Kitchen  Review of Systems  Positive: Left calf pain, bilateral extremity pain, shortness of breath, cough, chills Negative: Fevers  Physical Exam  BP 138/70 (BP Location: Right Arm)    Pulse 87    Temp 98.2 F (36.8 C) (Oral)    Resp 16    Ht 5\' 4"  (1.626 m)    Wt 108 kg    SpO2 97%    BMI 40.87 kg/m  Gen:   Awake, no distress   Resp:  Normal effort  MSK:   Moves extremities without difficulty  Other:  Rales in the right lung base.  Bilateral lower extremity 2+ edema, pitting on the left with exquisite left calf tenderness to palpation.  Medical Decision Making  Medically screening exam initiated at 8:48 PM.  Appropriate orders placed.  Jennifer Curry was informed that the remainder of the evaluation will be completed by another provider, this initial triage assessment does not replace that evaluation, and the importance of remaining in the ED until their evaluation is complete.  Vascular ultrasound is left for the evening, will order outpatient DVT study.  We will proceed with potential heart failure work-up this evening given rales, edema, and shortness of breath.  This chart was dictated using voice recognition software, Dragon. Despite the best efforts of this provider to proofread and correct errors, errors may still occur which can change documentation meaning.    Emeline Darling, PA-C 02/17/21 2049    Lorelle Gibbs, DO 02/17/21 2352

## 2021-02-17 NOTE — ED Triage Notes (Signed)
Patient reports left lower leg pain with swelling onset this week , denies injury , she is concerned of blood clot. Respirations unlabored .

## 2021-02-18 ENCOUNTER — Emergency Department (HOSPITAL_BASED_OUTPATIENT_CLINIC_OR_DEPARTMENT_OTHER)
Admit: 2021-02-18 | Discharge: 2021-02-18 | Disposition: A | Discharge status: Home / Self Care | Payer: Medicare Other | Attending: Emergency Medicine | Admitting: Emergency Medicine

## 2021-02-18 DIAGNOSIS — M79605 Pain in left leg: Secondary | ICD-10-CM

## 2021-02-18 DIAGNOSIS — M7989 Other specified soft tissue disorders: Secondary | ICD-10-CM | POA: Diagnosis not present

## 2021-02-18 LAB — TROPONIN I (HIGH SENSITIVITY): Troponin I (High Sensitivity): 4 ng/L (ref <18)

## 2021-02-18 NOTE — ED Provider Notes (Signed)
[facility] EMERGENCY DEPARTMENT Provider Note   CSN: [id number] Arrival date & time: 02/17/21  1930     History No chief complaint on file.   Jennifer Curry is a 75 y.o. female.  HPI  Patient without significant medical history presents with chief complaints of leg swelling.  Patient states she noted leg swelling started on Monday night, she states both her legs are swollen, she states they feel tight and are painful, swelling has increased and gotten worse, she has no associated shortness of breath, chest pain, pleuritic chest pain, she denies recent trauma to her legs, no recent travels, she has no history of PEs or DVTs currently not on hormone therapy.  She has no significant cardiac history, she denies alcohol use or nicotine use, she does endorse that she is currently being treated for bronchitis and was started on a steroid on Thursday she noticed the swelling on the following Monday.  She denies alleviating or aggravating factors.  She has no complaints at this time.  Past Medical History:  Diagnosis Date   Allergic rhinitis    Hypertension    Obesity (BMI 30-39.9) 05/07/2013    Patient Active Problem List   Diagnosis Date Noted   Chronic cough 11/14/2014   Acne nodule - left cheek 06/11/2013   Epidermal inclusion cyst of back s/p excision 05/17/2013 05/07/2013   Obesity (BMI 30-39.9) 05/07/2013    Past Surgical History:  Procedure Laterality Date   DILATION AND CURETTAGE OF UTERUS     MOUTH SURGERY     TUBAL LIGATION       OB History   No obstetric history on file.     Family History  Problem Relation Age of Onset   Heart disease Mother     Social History   Tobacco Use   Smoking status: Never   Smokeless tobacco: Never  Substance Use Topics   Alcohol use: No   Drug use: No    Home Medications Prior to Admission medications   Medication Sig Start Date End Date Taking? Authorizing Provider  atorvastatin (LIPITOR) 10 MG tablet  Take 10 mg by mouth daily. 03/04/20   [provider]  Calcium Carbonate-Vit D-Min (CALCIUM 1200 PO) Take 1 tablet by mouth daily.    [provider]  Cholecalciferol (VITAMIN D-3) 1000 units CAPS Take 1 capsule by mouth daily.    [provider]  ferrous sulfate 325 (65 FE) MG tablet Take 325 mg by mouth daily with breakfast.    [provider]  Iron-Vitamins (GERITOL COMPLETE PO) Take 1 tablet by mouth daily.    [provider]  losartan (COZAAR) 100 MG tablet Take 100 mg by mouth daily. 11/24/19   [provider]  magnesium oxide (MAG-OX) 400 MG tablet Take 400 mg by mouth daily.    [provider]  Multiple Vitamin (MULTIVITAMIN) tablet Take 1 tablet by mouth daily.    [provider]  naproxen (NAPROSYN) 500 MG tablet Take 1 tablet (500 mg total) by mouth 2 (two) times daily with a meal. 05/19/20   Noemi Chapel, MD  pyridOXINE (VITAMIN B-6) 100 MG tablet Take 100 mg by mouth daily.    [provider]  vitamin B-12 (CYANOCOBALAMIN) 100 MCG tablet Take 100 mcg by mouth daily.    [provider]  vitamin E 180 MG (400 UNITS) capsule Take 400 Units by mouth daily.    [provider]    Allergies    Shellfish allergy,  Sulfa antibiotics, Oxycodone, and Vicodin [hydrocodone-acetaminophen]  Review of Systems   Review of Systems  Constitutional:  Negative for chills and fever.  HENT:  Negative for congestion.   Respiratory:  Negative for shortness of breath.   Cardiovascular:  Positive for leg swelling. Negative for chest pain.  Gastrointestinal:  Negative for abdominal pain.  Genitourinary:  Negative for enuresis.  Musculoskeletal:  Negative for back pain.  Skin:  Negative for rash.  Neurological:  Negative for dizziness.  Hematological:  Does not bruise/bleed easily.   Physical Exam Updated Vital Signs BP (!) 148/86 (BP Location: Right Arm)    Pulse 71    Temp 98 F (36.7 C)    Resp 16     Ht 5\' 4"  (1.626 m)    Wt 108 kg    SpO2 99%    BMI 40.87 kg/m   Physical Exam Vitals and nursing note reviewed.  Constitutional:      General: She is not in acute distress.    Appearance: She is not ill-appearing.  HENT:     Head: Normocephalic and atraumatic.     Nose: No congestion.  Eyes:     Conjunctiva/sclera: Conjunctivae normal.  Cardiovascular:     Rate and Rhythm: Normal rate and regular rhythm.     Pulses: Normal pulses.     Heart sounds: No murmur heard.   No friction rub. No gallop.  Pulmonary:     Effort: No respiratory distress.     Breath sounds: No wheezing, rhonchi or rales.  Musculoskeletal:     Right lower leg: Edema present.     Left lower leg: Edema present.     Comments: Lower extremities were visualized she had noted bilateral swelling no unilateral swelling present, no skin changes, slight 1 plus pitting edema bilaterally, neurovascular fully intact, no palpable cords or calf tenderness.  Skin:    General: Skin is warm and dry.  Neurological:     Mental Status: She is alert.  Psychiatric:        Mood and Affect: Mood normal.    ED Results / Procedures / Treatments   Labs (all labs ordered are listed, but only abnormal results are displayed) Labs Reviewed  BASIC METABOLIC PANEL - Abnormal; Notable for the following components:      Result Value   Glucose, Bld 248 (*)    Calcium 8.4 (*)    All other components within normal limits  BRAIN NATRIURETIC PEPTIDE - Abnormal; Notable for the following components:   B Natriuretic Peptide 177.4 (*)    All other components within normal limits  CBC WITH DIFFERENTIAL/PLATELET  TROPONIN I (HIGH SENSITIVITY)  TROPONIN I (HIGH SENSITIVITY)    EKG EKG Interpretation  Date/Time:  Tuesday February 17 2021 21:02:30 EST Ventricular Rate:  75 PR Interval:  144 QRS Duration: 90 QT Interval:  402 QTC Calculation: 448 R Axis:   26 Text Interpretation: Sinus rhythm with frequent Premature ventricular  complexes Otherwise normal ECG When compared with ECG of 01/26/2013, Premature ventricular complexes are now present Confirmed by Delora Fuel ([id number]) on 02/17/2021 11:24:03 PM  Radiology DG Chest 2 View  Result Date: 02/17/2021 CLINICAL DATA:  Shortness of breath, bilateral lower extremity edema. EXAM: CHEST - 2 VIEW COMPARISON:  Chest x-ray dated 06/06/2017. FINDINGS: Heart size and mediastinal contours are within normal limits. Lungs are clear. Lungs are hyperexpanded. No pleural effusion or pneumothorax is seen. Osseous structures about the chest are unremarkable. IMPRESSION: No active cardiopulmonary disease. No evidence of  pneumonia or pulmonary edema. Electronically Signed   By: Franki Cabot M.D.   On: 02/17/2021 21:39   LE VENOUS  Result Date: 02/18/2021  Lower Venous DVT Study Patient Name:  Jennifer Curry  Date of Exam:   02/18/2021 Medical Rec #: [id number]        Accession #:    [id number] Date of Birth: 1946/01/03        Patient Gender: F Patient Age:   39 years Exam Location:  [facility] Procedure:      VAS [place] LOWER EXTREMITY VENOUS (DVT) Referring Phys: [facility] SPONSELLER --------------------------------------------------------------------------------  Indications: Pain, and Swelling.  Comparison Study: 07/04/20 prior Performing Technologist: Archie Patten RVS  Examination Guidelines: A complete evaluation includes B-mode imaging, spectral Doppler, color Doppler, and power Doppler as needed of all accessible portions of each vessel. Bilateral testing is considered an integral part of a complete examination. Limited examinations for reoccurring indications may be performed as noted. The reflux portion of the exam is performed with the patient in reverse Trendelenburg.  +-----+---------------+---------+-----------+----------+--------------+  RIGHT Compressibility Phasicity Spontaneity Properties Thrombus Aging  +-----+---------------+---------+-----------+----------+--------------+   CFV   Full            Yes       Yes                                    +-----+---------------+---------+-----------+----------+--------------+   +---------+---------------+---------+-----------+----------+--------------+  LEFT      Compressibility Phasicity Spontaneity Properties Thrombus Aging  +---------+---------------+---------+-----------+----------+--------------+  CFV       Full            Yes       Yes                                    +---------+---------------+---------+-----------+----------+--------------+  SFJ       Full                                                             +---------+---------------+---------+-----------+----------+--------------+  FV Prox   Full                                                             +---------+---------------+---------+-----------+----------+--------------+  FV Mid    Full                                                             +---------+---------------+---------+-----------+----------+--------------+  FV Distal Full                                                             +---------+---------------+---------+-----------+----------+--------------+  PFV       Full                                                             +---------+---------------+---------+-----------+----------+--------------+  POP       Full            Yes       Yes                                    +---------+---------------+---------+-----------+----------+--------------+  PTV       Full                                                             +---------+---------------+---------+-----------+----------+--------------+  PERO      Full                                                             +---------+---------------+---------+-----------+----------+--------------+     Summary: RIGHT: - No evidence of common femoral vein obstruction.  LEFT: - There is no evidence of deep vein thrombosis in the lower extremity.  - No cystic structure found in the popliteal fossa.  *See  table(s) above for measurements and observations.    Preliminary     Procedures Procedures   Medications Ordered in ED Medications - No data to display  ED Course  I have reviewed the triage vital signs and the nursing notes.  Pertinent labs & imaging results that were available during my care of the patient were reviewed by me and considered in my medical decision making (see chart for details).    MDM Rules/Calculators/A&P                         Initial impression-presents with leg swelling concerning for blood clot.  She is alert, no acute distress, vital signs reassuring, triage obtained lab work-up chest x-ray as well as DVT study.  Work-up-CBC is unremarkable, BMP shows glucose of 248, calcium 8.4, BNP 177.4, negative delta troponins, chest x-ray unremarkable, EKG sinus without signs of ischemia, negative DVT study.  Rule out- I have low suspicion for ACS as history is atypical, patient has no cardiac history, EKG was sinus rhythm without signs of ischemia, patient had a delta troponin.  Low suspicion for PE as patient denies pleuritic chest pain, shortness of breath, vital signs reassuring nontachypneic, nonhypoxic, no unilateral leg swelling present.  Low suspicion for AAA or aortic dissection as history is atypical, patient has low risk factors.  Low suspicion for CHF as there is no no rales heard my exam, no pleural effusions present.  Low suspicion for systemic infection as patient is nontoxic-appearing, vital signs reassuring, no obvious source infection noted on exam.  Low suspicion for DVT as DVTs are negative.   Plan-  Pedal edema-unclear etiology  but I suspect likely secondary due to recent steroid use, differential diagnosis includes liver cirrhosis, CHF, lymphedema, recommend compression stockings, leg elevation, following up with PCP for further work-up i.e. echocardiogram.  Vital signs have remained stable, no indication for hospital admission.    Patient given at home  care as well strict return precautions.  Patient verbalized that they understood agreed to said plan.     Final Clinical Impression(s) / ED Diagnoses Final diagnoses:  Leg swelling    Rx / DC Orders ED Discharge Orders          Ordered    LE VENOUS        02/17/21 2047             Marcello Fennel, PA-C 02/18/21 1442    Regan Lemming, MD 02/18/21 2117

## 2021-02-18 NOTE — ED Notes (Signed)
Pt verbalized understanding of d/c instructions, meds, and followup care- previous RN Clark. Denies questions. VSS, no distress noted. Steady gait to restroom to put on clothes-unassisted. Brought to lobby in wheelchair as family is en-route and nearby. Left by security.

## 2021-02-18 NOTE — Progress Notes (Signed)
Lower extremity venous has been completed.   Preliminary results in CV Proc.   Jennifer Curry Jennifer Curry 02/18/2021 9:22 AM

## 2021-02-18 NOTE — Discharge Instructions (Signed)
Lab work and imaging are all reassuring, I recommend while your legs are not in use, to keep it elevated, either with pillows underneath  legs or resting  legs on the arm rest of a couch while you are laying down.  I have placed wrappings around your legs please wear during the day you may take off at nighttime.  Please follow-up with your PCP for further evaluation   Come back to the emergency department if you develop chest pain, shortness of breath, severe abdominal pain, uncontrolled nausea, vomiting, diarrhea.

## 2021-02-24 DIAGNOSIS — Z6835 Body mass index (BMI) 35.0-35.9, adult: Secondary | ICD-10-CM | POA: Diagnosis not present

## 2021-02-24 DIAGNOSIS — Z20822 Contact with and (suspected) exposure to covid-19: Secondary | ICD-10-CM | POA: Diagnosis not present

## 2021-02-24 DIAGNOSIS — J40 Bronchitis, not specified as acute or chronic: Secondary | ICD-10-CM | POA: Diagnosis not present

## 2021-02-24 DIAGNOSIS — F4329 Adjustment disorder with other symptoms: Secondary | ICD-10-CM | POA: Diagnosis not present

## 2021-02-24 DIAGNOSIS — E559 Vitamin D deficiency, unspecified: Secondary | ICD-10-CM | POA: Diagnosis not present

## 2021-02-24 DIAGNOSIS — R197 Diarrhea, unspecified: Secondary | ICD-10-CM | POA: Diagnosis not present

## 2021-02-24 DIAGNOSIS — M8580 Other specified disorders of bone density and structure, unspecified site: Secondary | ICD-10-CM | POA: Diagnosis not present

## 2021-02-24 DIAGNOSIS — I10 Essential (primary) hypertension: Secondary | ICD-10-CM | POA: Diagnosis not present

## 2021-02-24 DIAGNOSIS — E7800 Pure hypercholesterolemia, unspecified: Secondary | ICD-10-CM | POA: Diagnosis not present

## 2021-02-24 DIAGNOSIS — R600 Localized edema: Secondary | ICD-10-CM | POA: Diagnosis not present

## 2021-02-27 DIAGNOSIS — Z1231 Encounter for screening mammogram for malignant neoplasm of breast: Secondary | ICD-10-CM | POA: Diagnosis not present

## 2021-03-05 DIAGNOSIS — R600 Localized edema: Secondary | ICD-10-CM | POA: Diagnosis not present

## 2021-03-05 DIAGNOSIS — J209 Acute bronchitis, unspecified: Secondary | ICD-10-CM | POA: Diagnosis not present

## 2021-03-05 DIAGNOSIS — I10 Essential (primary) hypertension: Secondary | ICD-10-CM | POA: Diagnosis not present

## 2021-03-05 DIAGNOSIS — Z6835 Body mass index (BMI) 35.0-35.9, adult: Secondary | ICD-10-CM | POA: Diagnosis not present

## 2021-03-05 DIAGNOSIS — M8580 Other specified disorders of bone density and structure, unspecified site: Secondary | ICD-10-CM | POA: Diagnosis not present

## 2021-03-05 DIAGNOSIS — E559 Vitamin D deficiency, unspecified: Secondary | ICD-10-CM | POA: Diagnosis not present

## 2021-03-05 DIAGNOSIS — E7800 Pure hypercholesterolemia, unspecified: Secondary | ICD-10-CM | POA: Diagnosis not present

## 2021-03-05 DIAGNOSIS — F4329 Adjustment disorder with other symptoms: Secondary | ICD-10-CM | POA: Diagnosis not present

## 2021-03-05 DIAGNOSIS — R197 Diarrhea, unspecified: Secondary | ICD-10-CM | POA: Diagnosis not present

## 2021-03-05 DIAGNOSIS — R5383 Other fatigue: Secondary | ICD-10-CM | POA: Diagnosis not present

## 2021-04-13 DIAGNOSIS — Z6835 Body mass index (BMI) 35.0-35.9, adult: Secondary | ICD-10-CM | POA: Diagnosis not present

## 2021-04-13 DIAGNOSIS — E7800 Pure hypercholesterolemia, unspecified: Secondary | ICD-10-CM | POA: Diagnosis not present

## 2021-04-13 DIAGNOSIS — R600 Localized edema: Secondary | ICD-10-CM | POA: Diagnosis not present

## 2021-04-13 DIAGNOSIS — J40 Bronchitis, not specified as acute or chronic: Secondary | ICD-10-CM | POA: Diagnosis not present

## 2021-04-13 DIAGNOSIS — I10 Essential (primary) hypertension: Secondary | ICD-10-CM | POA: Diagnosis not present

## 2021-04-13 DIAGNOSIS — R5383 Other fatigue: Secondary | ICD-10-CM | POA: Diagnosis not present

## 2021-04-13 DIAGNOSIS — E559 Vitamin D deficiency, unspecified: Secondary | ICD-10-CM | POA: Diagnosis not present

## 2021-04-13 DIAGNOSIS — M8580 Other specified disorders of bone density and structure, unspecified site: Secondary | ICD-10-CM | POA: Diagnosis not present

## 2021-04-13 DIAGNOSIS — F4329 Adjustment disorder with other symptoms: Secondary | ICD-10-CM | POA: Diagnosis not present

## 2021-05-11 DIAGNOSIS — E7800 Pure hypercholesterolemia, unspecified: Secondary | ICD-10-CM | POA: Diagnosis not present

## 2021-05-11 DIAGNOSIS — E559 Vitamin D deficiency, unspecified: Secondary | ICD-10-CM | POA: Diagnosis not present

## 2021-05-11 DIAGNOSIS — R600 Localized edema: Secondary | ICD-10-CM | POA: Diagnosis not present

## 2021-05-11 DIAGNOSIS — I10 Essential (primary) hypertension: Secondary | ICD-10-CM | POA: Diagnosis not present

## 2021-05-11 DIAGNOSIS — M8580 Other specified disorders of bone density and structure, unspecified site: Secondary | ICD-10-CM | POA: Diagnosis not present

## 2021-05-11 DIAGNOSIS — Z6835 Body mass index (BMI) 35.0-35.9, adult: Secondary | ICD-10-CM | POA: Diagnosis not present

## 2021-05-11 DIAGNOSIS — F4329 Adjustment disorder with other symptoms: Secondary | ICD-10-CM | POA: Diagnosis not present

## 2021-06-22 DIAGNOSIS — I10 Essential (primary) hypertension: Secondary | ICD-10-CM | POA: Diagnosis not present

## 2021-06-22 DIAGNOSIS — Z6835 Body mass index (BMI) 35.0-35.9, adult: Secondary | ICD-10-CM | POA: Diagnosis not present

## 2021-06-22 DIAGNOSIS — F4329 Adjustment disorder with other symptoms: Secondary | ICD-10-CM | POA: Diagnosis not present

## 2021-06-22 DIAGNOSIS — M8580 Other specified disorders of bone density and structure, unspecified site: Secondary | ICD-10-CM | POA: Diagnosis not present

## 2021-06-22 DIAGNOSIS — E559 Vitamin D deficiency, unspecified: Secondary | ICD-10-CM | POA: Diagnosis not present

## 2021-06-22 DIAGNOSIS — E7800 Pure hypercholesterolemia, unspecified: Secondary | ICD-10-CM | POA: Diagnosis not present

## 2021-06-22 DIAGNOSIS — R600 Localized edema: Secondary | ICD-10-CM | POA: Diagnosis not present

## 2021-09-04 DIAGNOSIS — I10 Essential (primary) hypertension: Secondary | ICD-10-CM | POA: Diagnosis not present

## 2021-09-04 DIAGNOSIS — F3341 Major depressive disorder, recurrent, in partial remission: Secondary | ICD-10-CM | POA: Diagnosis not present

## 2021-09-04 DIAGNOSIS — E7800 Pure hypercholesterolemia, unspecified: Secondary | ICD-10-CM | POA: Diagnosis not present

## 2021-11-16 DIAGNOSIS — R051 Acute cough: Secondary | ICD-10-CM | POA: Diagnosis not present

## 2021-11-25 DIAGNOSIS — J209 Acute bronchitis, unspecified: Secondary | ICD-10-CM | POA: Diagnosis not present

## 2021-11-25 DIAGNOSIS — Z6836 Body mass index (BMI) 36.0-36.9, adult: Secondary | ICD-10-CM | POA: Diagnosis not present

## 2021-12-02 DIAGNOSIS — Z6837 Body mass index (BMI) 37.0-37.9, adult: Secondary | ICD-10-CM | POA: Diagnosis not present

## 2021-12-02 DIAGNOSIS — R059 Cough, unspecified: Secondary | ICD-10-CM | POA: Diagnosis not present
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
## 2022-02-01 DIAGNOSIS — Z03818 Encounter for observation for suspected exposure to other biological agents ruled out: Secondary | ICD-10-CM | POA: Diagnosis not present

## 2022-02-01 DIAGNOSIS — Z6838 Body mass index (BMI) 38.0-38.9, adult: Secondary | ICD-10-CM | POA: Diagnosis not present

## 2022-02-01 DIAGNOSIS — J069 Acute upper respiratory infection, unspecified: Secondary | ICD-10-CM | POA: Diagnosis not present

## 2022-02-15 DIAGNOSIS — S52502A Unspecified fracture of the lower end of left radius, initial encounter for closed fracture: Secondary | ICD-10-CM | POA: Diagnosis not present

## 2022-02-15 DIAGNOSIS — R600 Localized edema: Secondary | ICD-10-CM | POA: Diagnosis not present

## 2022-02-15 DIAGNOSIS — I493 Ventricular premature depolarization: Secondary | ICD-10-CM | POA: Diagnosis not present

## 2022-02-15 DIAGNOSIS — S0990XA Unspecified injury of head, initial encounter: Secondary | ICD-10-CM | POA: Diagnosis not present

## 2022-02-15 DIAGNOSIS — W101XXA Fall (on)(from) sidewalk curb, initial encounter: Secondary | ICD-10-CM | POA: Diagnosis not present

## 2022-02-15 DIAGNOSIS — M25532 Pain in left wrist: Secondary | ICD-10-CM | POA: Diagnosis not present

## 2022-02-15 DIAGNOSIS — S52572A Other intraarticular fracture of lower end of left radius, initial encounter for closed fracture: Secondary | ICD-10-CM | POA: Diagnosis not present

## 2022-02-15 DIAGNOSIS — R55 Syncope and collapse: Secondary | ICD-10-CM | POA: Diagnosis not present

## 2022-02-15 DIAGNOSIS — Y998 Other external cause status: Secondary | ICD-10-CM | POA: Diagnosis not present

## 2022-02-15 DIAGNOSIS — Z043 Encounter for examination and observation following other accident: Secondary | ICD-10-CM | POA: Diagnosis not present

## 2022-02-15 DIAGNOSIS — R42 Dizziness and giddiness: Secondary | ICD-10-CM | POA: Diagnosis not present

## 2022-02-16 DIAGNOSIS — I493 Ventricular premature depolarization: Secondary | ICD-10-CM | POA: Diagnosis not present

## 2022-02-23 DIAGNOSIS — M25532 Pain in left wrist: Secondary | ICD-10-CM | POA: Diagnosis not present

## 2022-02-23 DIAGNOSIS — S52572D Other intraarticular fracture of lower end of left radius, subsequent encounter for closed fracture with routine healing: Secondary | ICD-10-CM | POA: Diagnosis not present

## 2022-03-23 DIAGNOSIS — M25532 Pain in left wrist: Secondary | ICD-10-CM | POA: Diagnosis not present

## 2022-03-23 DIAGNOSIS — S52532A Colles' fracture of left radius, initial encounter for closed fracture: Secondary | ICD-10-CM | POA: Diagnosis not present

## 2022-03-29 DIAGNOSIS — F3341 Major depressive disorder, recurrent, in partial remission: Secondary | ICD-10-CM | POA: Diagnosis not present

## 2022-03-29 DIAGNOSIS — M8580 Other specified disorders of bone density and structure, unspecified site: Secondary | ICD-10-CM | POA: Diagnosis not present

## 2022-03-29 DIAGNOSIS — E7800 Pure hypercholesterolemia, unspecified: Secondary | ICD-10-CM | POA: Diagnosis not present

## 2022-03-29 DIAGNOSIS — Z1239 Encounter for other screening for malignant neoplasm of breast: Secondary | ICD-10-CM | POA: Diagnosis not present

## 2022-03-29 DIAGNOSIS — R0602 Shortness of breath: Secondary | ICD-10-CM | POA: Diagnosis not present

## 2022-03-29 DIAGNOSIS — I10 Essential (primary) hypertension: Secondary | ICD-10-CM | POA: Diagnosis not present

## 2022-03-29 DIAGNOSIS — E559 Vitamin D deficiency, unspecified: Secondary | ICD-10-CM | POA: Diagnosis not present

## 2022-03-29 DIAGNOSIS — Z0000 Encounter for general adult medical examination without abnormal findings: Secondary | ICD-10-CM | POA: Diagnosis not present

## 2022-03-29 DIAGNOSIS — R600 Localized edema: Secondary | ICD-10-CM | POA: Diagnosis not present

## 2022-04-06 DIAGNOSIS — M85852 Other specified disorders of bone density and structure, left thigh: Secondary | ICD-10-CM | POA: Diagnosis not present

## 2022-04-06 DIAGNOSIS — R922 Inconclusive mammogram: Secondary | ICD-10-CM | POA: Diagnosis not present

## 2022-04-20 DIAGNOSIS — M25532 Pain in left wrist: Secondary | ICD-10-CM | POA: Diagnosis not present

## 2022-04-26 DIAGNOSIS — I10 Essential (primary) hypertension: Secondary | ICD-10-CM | POA: Diagnosis not present

## 2022-04-26 DIAGNOSIS — R600 Localized edema: Secondary | ICD-10-CM | POA: Diagnosis not present

## 2022-05-07 DIAGNOSIS — R269 Unspecified abnormalities of gait and mobility: Secondary | ICD-10-CM | POA: Diagnosis present

## 2022-05-07 DIAGNOSIS — M542 Cervicalgia: Secondary | ICD-10-CM | POA: Diagnosis not present

## 2022-05-07 DIAGNOSIS — I517 Cardiomegaly: Secondary | ICD-10-CM | POA: Diagnosis not present

## 2022-05-07 DIAGNOSIS — R7989 Other specified abnormal findings of blood chemistry: Secondary | ICD-10-CM | POA: Diagnosis not present

## 2022-05-07 DIAGNOSIS — Z0189 Encounter for other specified special examinations: Secondary | ICD-10-CM | POA: Diagnosis not present

## 2022-05-07 DIAGNOSIS — Z043 Encounter for examination and observation following other accident: Secondary | ICD-10-CM | POA: Diagnosis not present

## 2022-05-07 DIAGNOSIS — I340 Nonrheumatic mitral (valve) insufficiency: Secondary | ICD-10-CM | POA: Diagnosis not present

## 2022-05-07 DIAGNOSIS — R519 Headache, unspecified: Secondary | ICD-10-CM | POA: Diagnosis not present

## 2022-05-07 DIAGNOSIS — E559 Vitamin D deficiency, unspecified: Secondary | ICD-10-CM | POA: Diagnosis not present

## 2022-05-07 DIAGNOSIS — D649 Anemia, unspecified: Secondary | ICD-10-CM | POA: Diagnosis not present

## 2022-05-07 DIAGNOSIS — R0609 Other forms of dyspnea: Secondary | ICD-10-CM | POA: Diagnosis not present

## 2022-05-07 DIAGNOSIS — R059 Cough, unspecified: Secondary | ICD-10-CM | POA: Diagnosis not present

## 2022-05-07 DIAGNOSIS — I10 Essential (primary) hypertension: Secondary | ICD-10-CM | POA: Diagnosis not present

## 2022-05-07 DIAGNOSIS — R55 Syncope and collapse: Secondary | ICD-10-CM | POA: Diagnosis not present

## 2022-05-07 DIAGNOSIS — H532 Diplopia: Secondary | ICD-10-CM | POA: Diagnosis not present

## 2022-05-07 DIAGNOSIS — E785 Hyperlipidemia, unspecified: Secondary | ICD-10-CM | POA: Diagnosis not present

## 2022-05-07 DIAGNOSIS — R262 Difficulty in walking, not elsewhere classified: Secondary | ICD-10-CM | POA: Diagnosis not present

## 2022-05-07 DIAGNOSIS — R42 Dizziness and giddiness: Secondary | ICD-10-CM | POA: Diagnosis not present

## 2022-05-14 DIAGNOSIS — I6782 Cerebral ischemia: Secondary | ICD-10-CM | POA: Diagnosis not present

## 2022-05-14 DIAGNOSIS — Z09 Encounter for follow-up examination after completed treatment for conditions other than malignant neoplasm: Secondary | ICD-10-CM | POA: Diagnosis not present

## 2022-05-14 DIAGNOSIS — M8580 Other specified disorders of bone density and structure, unspecified site: Secondary | ICD-10-CM | POA: Diagnosis not present

## 2022-05-14 DIAGNOSIS — I10 Essential (primary) hypertension: Secondary | ICD-10-CM | POA: Diagnosis not present

## 2022-05-14 DIAGNOSIS — D649 Anemia, unspecified: Secondary | ICD-10-CM | POA: Diagnosis not present

## 2022-05-14 DIAGNOSIS — I6529 Occlusion and stenosis of unspecified carotid artery: Secondary | ICD-10-CM | POA: Diagnosis not present

## 2022-05-25 DIAGNOSIS — M25532 Pain in left wrist: Secondary | ICD-10-CM | POA: Diagnosis not present

## 2022-05-25 DIAGNOSIS — M25642 Stiffness of left hand, not elsewhere classified: Secondary | ICD-10-CM | POA: Diagnosis not present

## 2022-05-25 DIAGNOSIS — S52502D Unspecified fracture of the lower end of left radius, subsequent encounter for closed fracture with routine healing: Secondary | ICD-10-CM | POA: Diagnosis not present

## 2022-05-25 DIAGNOSIS — R42 Dizziness and giddiness: Secondary | ICD-10-CM | POA: Diagnosis not present

## 2022-05-31 DIAGNOSIS — R42 Dizziness and giddiness: Secondary | ICD-10-CM | POA: Diagnosis not present

## 2022-05-31 DIAGNOSIS — S52502D Unspecified fracture of the lower end of left radius, subsequent encounter for closed fracture with routine healing: Secondary | ICD-10-CM | POA: Diagnosis not present

## 2022-05-31 DIAGNOSIS — M25532 Pain in left wrist: Secondary | ICD-10-CM | POA: Diagnosis not present

## 2022-05-31 DIAGNOSIS — M25642 Stiffness of left hand, not elsewhere classified: Secondary | ICD-10-CM | POA: Diagnosis not present

## 2022-06-04 DIAGNOSIS — R42 Dizziness and giddiness: Secondary | ICD-10-CM | POA: Diagnosis not present

## 2022-06-04 DIAGNOSIS — M25642 Stiffness of left hand, not elsewhere classified: Secondary | ICD-10-CM | POA: Diagnosis not present

## 2022-06-04 DIAGNOSIS — M25532 Pain in left wrist: Secondary | ICD-10-CM | POA: Diagnosis not present

## 2022-06-04 DIAGNOSIS — S52502D Unspecified fracture of the lower end of left radius, subsequent encounter for closed fracture with routine healing: Secondary | ICD-10-CM | POA: Diagnosis not present

## 2022-06-09 DIAGNOSIS — M25532 Pain in left wrist: Secondary | ICD-10-CM | POA: Diagnosis not present

## 2022-06-09 DIAGNOSIS — R42 Dizziness and giddiness: Secondary | ICD-10-CM | POA: Diagnosis not present

## 2022-06-09 DIAGNOSIS — M25642 Stiffness of left hand, not elsewhere classified: Secondary | ICD-10-CM | POA: Diagnosis not present

## 2022-06-09 DIAGNOSIS — S52502D Unspecified fracture of the lower end of left radius, subsequent encounter for closed fracture with routine healing: Secondary | ICD-10-CM | POA: Diagnosis not present

## 2022-06-14 DIAGNOSIS — M25532 Pain in left wrist: Secondary | ICD-10-CM | POA: Diagnosis not present

## 2022-06-14 DIAGNOSIS — M25642 Stiffness of left hand, not elsewhere classified: Secondary | ICD-10-CM | POA: Diagnosis not present

## 2022-06-14 DIAGNOSIS — S52502D Unspecified fracture of the lower end of left radius, subsequent encounter for closed fracture with routine healing: Secondary | ICD-10-CM | POA: Diagnosis not present

## 2022-06-14 DIAGNOSIS — R42 Dizziness and giddiness: Secondary | ICD-10-CM | POA: Diagnosis not present

## 2022-06-17 DIAGNOSIS — M25532 Pain in left wrist: Secondary | ICD-10-CM | POA: Diagnosis not present

## 2022-06-17 DIAGNOSIS — S52502D Unspecified fracture of the lower end of left radius, subsequent encounter for closed fracture with routine healing: Secondary | ICD-10-CM | POA: Diagnosis not present

## 2022-06-17 DIAGNOSIS — M25642 Stiffness of left hand, not elsewhere classified: Secondary | ICD-10-CM | POA: Diagnosis not present

## 2022-06-17 DIAGNOSIS — R42 Dizziness and giddiness: Secondary | ICD-10-CM | POA: Diagnosis not present

## 2022-06-28 DIAGNOSIS — M25532 Pain in left wrist: Secondary | ICD-10-CM | POA: Diagnosis not present

## 2022-06-28 DIAGNOSIS — M25642 Stiffness of left hand, not elsewhere classified: Secondary | ICD-10-CM | POA: Diagnosis not present

## 2022-06-28 DIAGNOSIS — R42 Dizziness and giddiness: Secondary | ICD-10-CM | POA: Diagnosis not present

## 2022-06-28 DIAGNOSIS — S52502D Unspecified fracture of the lower end of left radius, subsequent encounter for closed fracture with routine healing: Secondary | ICD-10-CM | POA: Diagnosis not present

## 2022-07-01 DIAGNOSIS — M25642 Stiffness of left hand, not elsewhere classified: Secondary | ICD-10-CM | POA: Diagnosis not present

## 2022-07-01 DIAGNOSIS — R42 Dizziness and giddiness: Secondary | ICD-10-CM | POA: Diagnosis not present

## 2022-07-01 DIAGNOSIS — S52502D Unspecified fracture of the lower end of left radius, subsequent encounter for closed fracture with routine healing: Secondary | ICD-10-CM | POA: Diagnosis not present

## 2022-07-01 DIAGNOSIS — M25532 Pain in left wrist: Secondary | ICD-10-CM | POA: Diagnosis not present

## 2022-07-06 DIAGNOSIS — M25532 Pain in left wrist: Secondary | ICD-10-CM | POA: Diagnosis not present

## 2022-07-06 DIAGNOSIS — S52502D Unspecified fracture of the lower end of left radius, subsequent encounter for closed fracture with routine healing: Secondary | ICD-10-CM | POA: Diagnosis not present

## 2022-07-06 DIAGNOSIS — M25642 Stiffness of left hand, not elsewhere classified: Secondary | ICD-10-CM | POA: Diagnosis not present

## 2022-07-06 DIAGNOSIS — R42 Dizziness and giddiness: Secondary | ICD-10-CM | POA: Diagnosis not present

## 2022-07-09 DIAGNOSIS — R42 Dizziness and giddiness: Secondary | ICD-10-CM | POA: Diagnosis not present

## 2022-07-09 DIAGNOSIS — M25642 Stiffness of left hand, not elsewhere classified: Secondary | ICD-10-CM | POA: Diagnosis not present

## 2022-07-09 DIAGNOSIS — M25532 Pain in left wrist: Secondary | ICD-10-CM | POA: Diagnosis not present

## 2022-07-09 DIAGNOSIS — S52502D Unspecified fracture of the lower end of left radius, subsequent encounter for closed fracture with routine healing: Secondary | ICD-10-CM | POA: Diagnosis not present

## 2022-07-16 DIAGNOSIS — S52502D Unspecified fracture of the lower end of left radius, subsequent encounter for closed fracture with routine healing: Secondary | ICD-10-CM | POA: Diagnosis not present

## 2022-07-16 DIAGNOSIS — R42 Dizziness and giddiness: Secondary | ICD-10-CM | POA: Diagnosis not present

## 2022-07-16 DIAGNOSIS — M25642 Stiffness of left hand, not elsewhere classified: Secondary | ICD-10-CM | POA: Diagnosis not present

## 2022-07-16 DIAGNOSIS — M25532 Pain in left wrist: Secondary | ICD-10-CM | POA: Diagnosis not present

## 2022-07-21 DIAGNOSIS — M25532 Pain in left wrist: Secondary | ICD-10-CM | POA: Diagnosis not present

## 2022-07-21 DIAGNOSIS — M25642 Stiffness of left hand, not elsewhere classified: Secondary | ICD-10-CM | POA: Diagnosis not present

## 2022-07-21 DIAGNOSIS — R42 Dizziness and giddiness: Secondary | ICD-10-CM | POA: Diagnosis not present

## 2022-07-21 DIAGNOSIS — S52502D Unspecified fracture of the lower end of left radius, subsequent encounter for closed fracture with routine healing: Secondary | ICD-10-CM | POA: Diagnosis not present

## 2022-07-23 DIAGNOSIS — S52502D Unspecified fracture of the lower end of left radius, subsequent encounter for closed fracture with routine healing: Secondary | ICD-10-CM | POA: Diagnosis not present

## 2022-07-23 DIAGNOSIS — M25642 Stiffness of left hand, not elsewhere classified: Secondary | ICD-10-CM | POA: Diagnosis not present

## 2022-07-23 DIAGNOSIS — R42 Dizziness and giddiness: Secondary | ICD-10-CM | POA: Diagnosis not present

## 2022-07-23 DIAGNOSIS — M25532 Pain in left wrist: Secondary | ICD-10-CM | POA: Diagnosis not present

## 2022-07-29 DIAGNOSIS — S52502D Unspecified fracture of the lower end of left radius, subsequent encounter for closed fracture with routine healing: Secondary | ICD-10-CM | POA: Diagnosis not present

## 2022-07-29 DIAGNOSIS — R42 Dizziness and giddiness: Secondary | ICD-10-CM | POA: Diagnosis not present

## 2022-07-29 DIAGNOSIS — M25642 Stiffness of left hand, not elsewhere classified: Secondary | ICD-10-CM | POA: Diagnosis not present

## 2022-07-29 DIAGNOSIS — M25532 Pain in left wrist: Secondary | ICD-10-CM | POA: Diagnosis not present

## 2022-08-05 DIAGNOSIS — R42 Dizziness and giddiness: Secondary | ICD-10-CM | POA: Diagnosis not present

## 2022-08-05 DIAGNOSIS — S52502D Unspecified fracture of the lower end of left radius, subsequent encounter for closed fracture with routine healing: Secondary | ICD-10-CM | POA: Diagnosis not present

## 2022-08-05 DIAGNOSIS — M25532 Pain in left wrist: Secondary | ICD-10-CM | POA: Diagnosis not present

## 2022-08-05 DIAGNOSIS — M25642 Stiffness of left hand, not elsewhere classified: Secondary | ICD-10-CM | POA: Diagnosis not present

## 2022-08-09 DIAGNOSIS — R42 Dizziness and giddiness: Secondary | ICD-10-CM | POA: Diagnosis not present

## 2022-08-09 DIAGNOSIS — S52502D Unspecified fracture of the lower end of left radius, subsequent encounter for closed fracture with routine healing: Secondary | ICD-10-CM | POA: Diagnosis not present

## 2022-08-09 DIAGNOSIS — M25642 Stiffness of left hand, not elsewhere classified: Secondary | ICD-10-CM | POA: Diagnosis not present

## 2022-08-09 DIAGNOSIS — M25532 Pain in left wrist: Secondary | ICD-10-CM | POA: Diagnosis not present

## 2022-08-12 DIAGNOSIS — M25532 Pain in left wrist: Secondary | ICD-10-CM | POA: Diagnosis not present

## 2022-08-12 DIAGNOSIS — M25642 Stiffness of left hand, not elsewhere classified: Secondary | ICD-10-CM | POA: Diagnosis not present

## 2022-08-12 DIAGNOSIS — S52502D Unspecified fracture of the lower end of left radius, subsequent encounter for closed fracture with routine healing: Secondary | ICD-10-CM | POA: Diagnosis not present

## 2022-08-12 DIAGNOSIS — R42 Dizziness and giddiness: Secondary | ICD-10-CM | POA: Diagnosis not present

## 2022-08-16 DIAGNOSIS — S52502D Unspecified fracture of the lower end of left radius, subsequent encounter for closed fracture with routine healing: Secondary | ICD-10-CM | POA: Diagnosis not present

## 2022-08-16 DIAGNOSIS — M25642 Stiffness of left hand, not elsewhere classified: Secondary | ICD-10-CM | POA: Diagnosis not present

## 2022-08-16 DIAGNOSIS — R42 Dizziness and giddiness: Secondary | ICD-10-CM | POA: Diagnosis not present

## 2022-08-16 DIAGNOSIS — M25532 Pain in left wrist: Secondary | ICD-10-CM | POA: Diagnosis not present

## 2022-08-19 DIAGNOSIS — S52502D Unspecified fracture of the lower end of left radius, subsequent encounter for closed fracture with routine healing: Secondary | ICD-10-CM | POA: Diagnosis not present

## 2022-08-19 DIAGNOSIS — M25642 Stiffness of left hand, not elsewhere classified: Secondary | ICD-10-CM | POA: Diagnosis not present

## 2022-08-19 DIAGNOSIS — M25532 Pain in left wrist: Secondary | ICD-10-CM | POA: Diagnosis not present

## 2022-08-19 DIAGNOSIS — R42 Dizziness and giddiness: Secondary | ICD-10-CM | POA: Diagnosis not present

## 2022-08-25 DIAGNOSIS — M25532 Pain in left wrist: Secondary | ICD-10-CM | POA: Diagnosis not present

## 2022-08-25 DIAGNOSIS — S52502D Unspecified fracture of the lower end of left radius, subsequent encounter for closed fracture with routine healing: Secondary | ICD-10-CM | POA: Diagnosis not present

## 2022-08-25 DIAGNOSIS — R42 Dizziness and giddiness: Secondary | ICD-10-CM | POA: Diagnosis not present

## 2022-08-25 DIAGNOSIS — M25642 Stiffness of left hand, not elsewhere classified: Secondary | ICD-10-CM | POA: Diagnosis not present

## 2022-08-30 DIAGNOSIS — R42 Dizziness and giddiness: Secondary | ICD-10-CM | POA: Diagnosis not present

## 2022-08-30 DIAGNOSIS — M25532 Pain in left wrist: Secondary | ICD-10-CM | POA: Diagnosis not present

## 2022-08-30 DIAGNOSIS — M25642 Stiffness of left hand, not elsewhere classified: Secondary | ICD-10-CM | POA: Diagnosis not present

## 2022-08-30 DIAGNOSIS — S52502D Unspecified fracture of the lower end of left radius, subsequent encounter for closed fracture with routine healing: Secondary | ICD-10-CM | POA: Diagnosis not present

## 2022-09-02 DIAGNOSIS — R42 Dizziness and giddiness: Secondary | ICD-10-CM | POA: Diagnosis not present

## 2022-09-02 DIAGNOSIS — M25532 Pain in left wrist: Secondary | ICD-10-CM | POA: Diagnosis not present

## 2022-09-02 DIAGNOSIS — M25642 Stiffness of left hand, not elsewhere classified: Secondary | ICD-10-CM | POA: Diagnosis not present

## 2022-09-02 DIAGNOSIS — S52502D Unspecified fracture of the lower end of left radius, subsequent encounter for closed fracture with routine healing: Secondary | ICD-10-CM | POA: Diagnosis not present

## 2022-09-06 DIAGNOSIS — M25642 Stiffness of left hand, not elsewhere classified: Secondary | ICD-10-CM | POA: Diagnosis not present

## 2022-09-06 DIAGNOSIS — M25532 Pain in left wrist: Secondary | ICD-10-CM | POA: Diagnosis not present

## 2022-09-06 DIAGNOSIS — R42 Dizziness and giddiness: Secondary | ICD-10-CM | POA: Diagnosis not present

## 2022-09-06 DIAGNOSIS — S52502D Unspecified fracture of the lower end of left radius, subsequent encounter for closed fracture with routine healing: Secondary | ICD-10-CM | POA: Diagnosis not present

## 2022-09-09 DIAGNOSIS — M25642 Stiffness of left hand, not elsewhere classified: Secondary | ICD-10-CM | POA: Diagnosis not present

## 2022-09-09 DIAGNOSIS — S52502D Unspecified fracture of the lower end of left radius, subsequent encounter for closed fracture with routine healing: Secondary | ICD-10-CM | POA: Diagnosis not present

## 2022-09-09 DIAGNOSIS — M25532 Pain in left wrist: Secondary | ICD-10-CM | POA: Diagnosis not present

## 2022-09-09 DIAGNOSIS — R42 Dizziness and giddiness: Secondary | ICD-10-CM | POA: Diagnosis not present

## 2022-09-13 DIAGNOSIS — M25642 Stiffness of left hand, not elsewhere classified: Secondary | ICD-10-CM | POA: Diagnosis not present

## 2022-09-13 DIAGNOSIS — R42 Dizziness and giddiness: Secondary | ICD-10-CM | POA: Diagnosis not present

## 2022-09-13 DIAGNOSIS — M25532 Pain in left wrist: Secondary | ICD-10-CM | POA: Diagnosis not present

## 2022-09-13 DIAGNOSIS — S52502D Unspecified fracture of the lower end of left radius, subsequent encounter for closed fracture with routine healing: Secondary | ICD-10-CM | POA: Diagnosis not present

## 2022-09-15 DIAGNOSIS — M25642 Stiffness of left hand, not elsewhere classified: Secondary | ICD-10-CM | POA: Diagnosis not present

## 2022-09-15 DIAGNOSIS — M25532 Pain in left wrist: Secondary | ICD-10-CM | POA: Diagnosis not present

## 2022-09-15 DIAGNOSIS — R42 Dizziness and giddiness: Secondary | ICD-10-CM | POA: Diagnosis not present

## 2022-09-15 DIAGNOSIS — S52502D Unspecified fracture of the lower end of left radius, subsequent encounter for closed fracture with routine healing: Secondary | ICD-10-CM | POA: Diagnosis not present

## 2022-09-26 IMAGING — CR DG CHEST 2V
2 series · 2 of 2 positions shown · non-contrast
Comparison: Chest x-ray dated 06/06/2017.

CLINICAL DATA: Shortness of breath, bilateral lower extremity
edema.

EXAM:
CHEST - 2 VIEW

[chest pa]
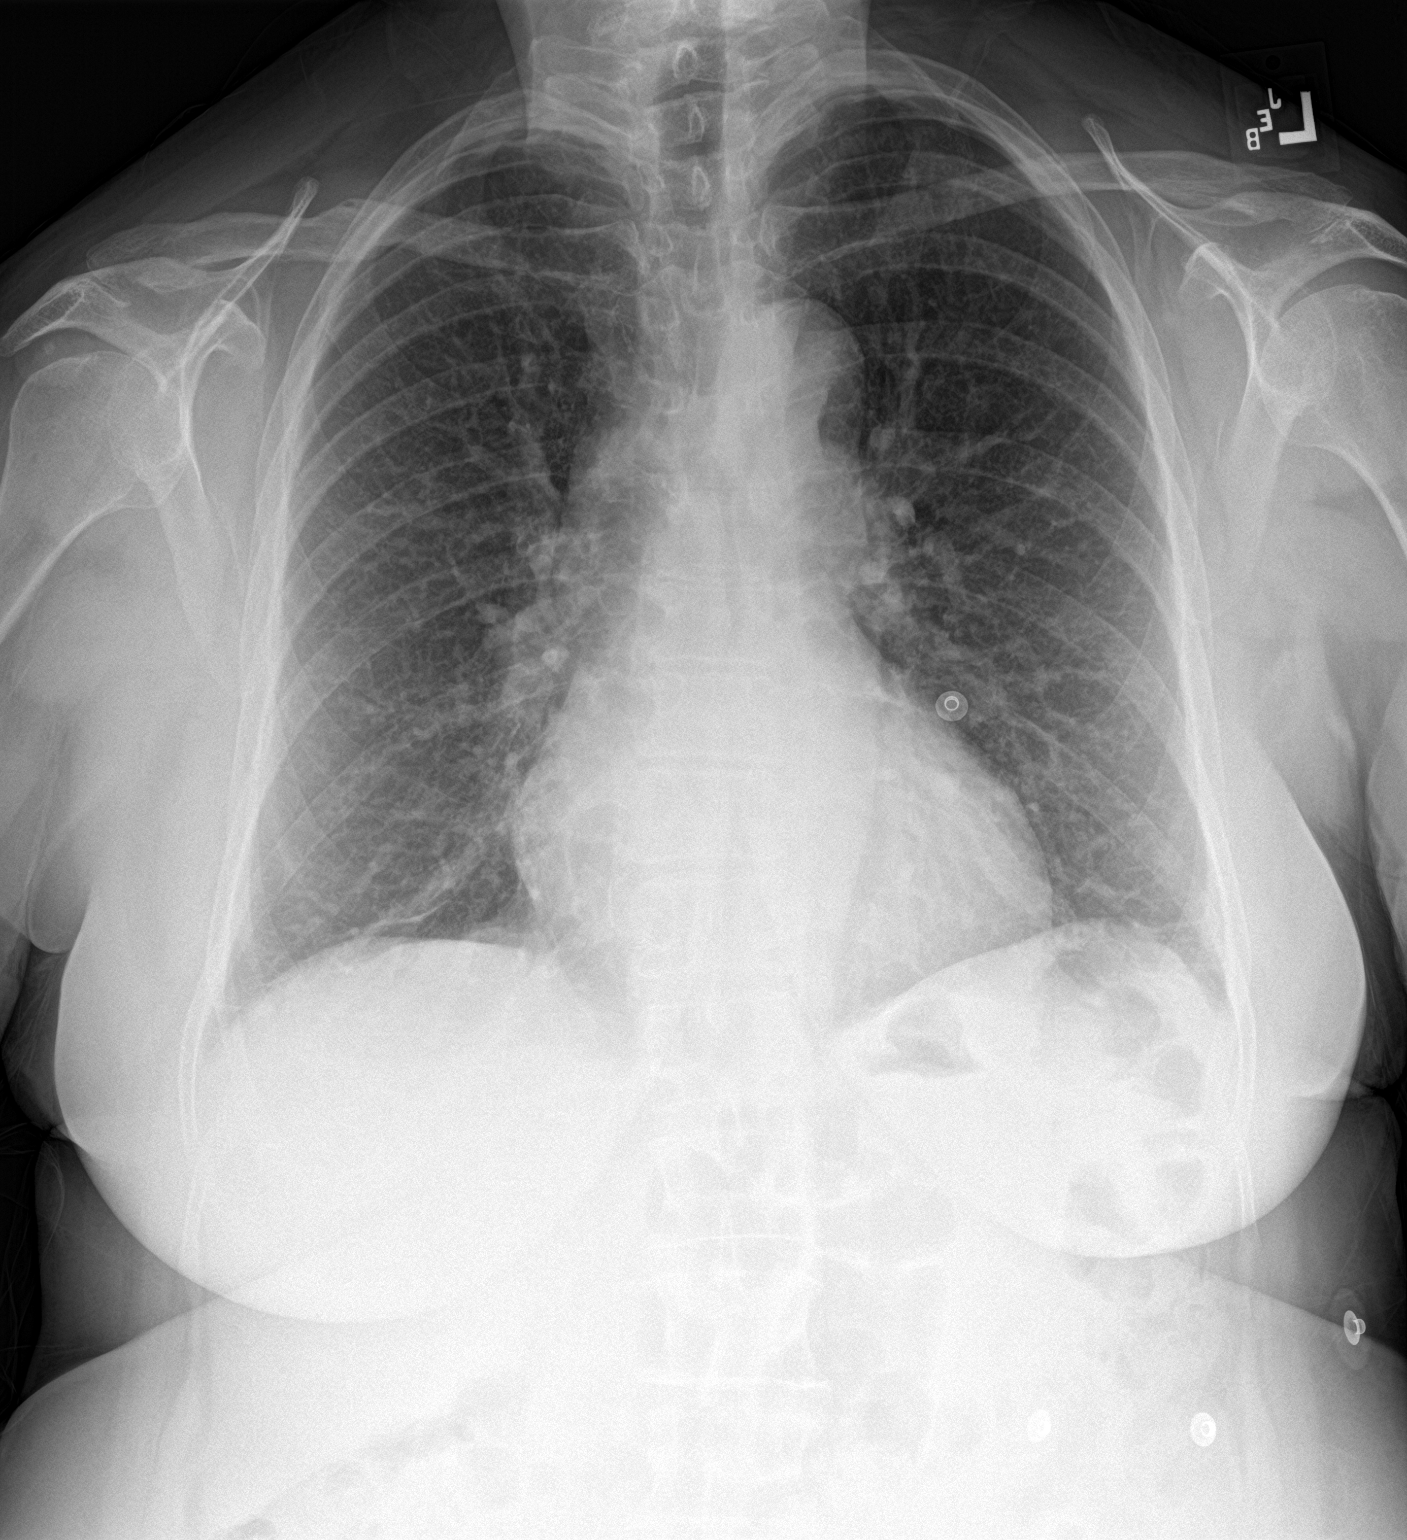

[chest lat]
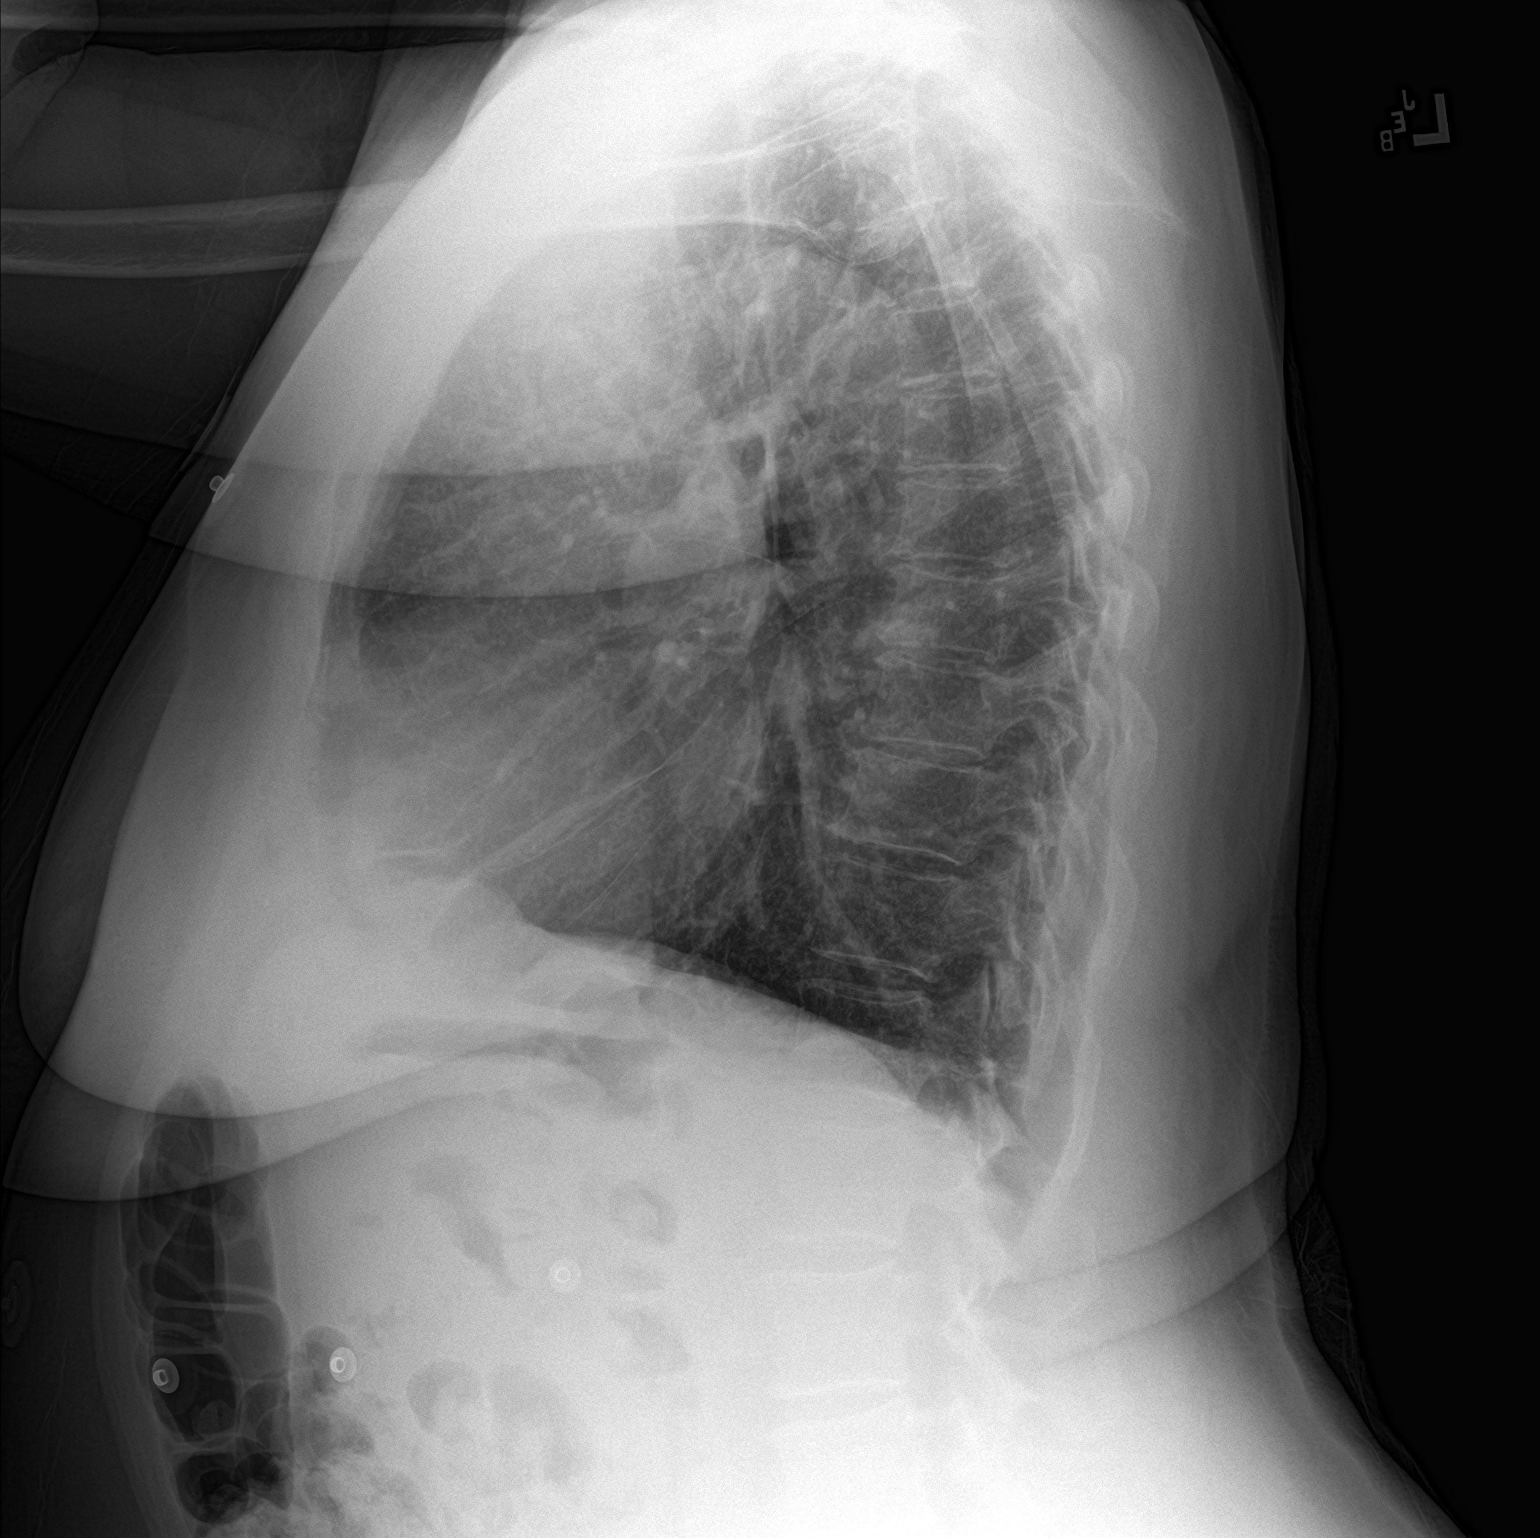

[2 of 2 positions shown; findings below may reference images not displayed]

FINDINGS: Heart size and mediastinal contours are within normal limits. Lungs
are clear. Lungs are hyperexpanded. No pleural effusion or
pneumothorax is seen. Osseous structures about the chest are
unremarkable.
IMPRESSION: No active cardiopulmonary disease. No evidence of pneumonia or
pulmonary edema.

## 2022-09-27 DIAGNOSIS — F3341 Major depressive disorder, recurrent, in partial remission: Secondary | ICD-10-CM | POA: Diagnosis not present

## 2022-09-27 DIAGNOSIS — E7800 Pure hypercholesterolemia, unspecified: Secondary | ICD-10-CM | POA: Diagnosis not present

## 2022-09-27 DIAGNOSIS — Z6837 Body mass index (BMI) 37.0-37.9, adult: Secondary | ICD-10-CM | POA: Diagnosis not present

## 2022-09-27 DIAGNOSIS — I10 Essential (primary) hypertension: Secondary | ICD-10-CM | POA: Diagnosis not present

## 2022-09-27 DIAGNOSIS — M8580 Other specified disorders of bone density and structure, unspecified site: Secondary | ICD-10-CM | POA: Diagnosis not present

## 2022-10-01 DIAGNOSIS — S52502D Unspecified fracture of the lower end of left radius, subsequent encounter for closed fracture with routine healing: Secondary | ICD-10-CM | POA: Diagnosis not present

## 2022-10-01 DIAGNOSIS — M25642 Stiffness of left hand, not elsewhere classified: Secondary | ICD-10-CM | POA: Diagnosis not present

## 2022-10-01 DIAGNOSIS — R42 Dizziness and giddiness: Secondary | ICD-10-CM | POA: Diagnosis not present

## 2022-10-01 DIAGNOSIS — M25532 Pain in left wrist: Secondary | ICD-10-CM | POA: Diagnosis not present

## 2022-10-05 DIAGNOSIS — M25642 Stiffness of left hand, not elsewhere classified: Secondary | ICD-10-CM | POA: Diagnosis not present

## 2022-10-05 DIAGNOSIS — R42 Dizziness and giddiness: Secondary | ICD-10-CM | POA: Diagnosis not present

## 2022-10-05 DIAGNOSIS — S52502D Unspecified fracture of the lower end of left radius, subsequent encounter for closed fracture with routine healing: Secondary | ICD-10-CM | POA: Diagnosis not present

## 2022-10-05 DIAGNOSIS — M25532 Pain in left wrist: Secondary | ICD-10-CM | POA: Diagnosis not present

## 2022-10-07 DIAGNOSIS — R42 Dizziness and giddiness: Secondary | ICD-10-CM | POA: Diagnosis not present

## 2022-10-07 DIAGNOSIS — M25532 Pain in left wrist: Secondary | ICD-10-CM | POA: Diagnosis not present

## 2022-10-07 DIAGNOSIS — M25642 Stiffness of left hand, not elsewhere classified: Secondary | ICD-10-CM | POA: Diagnosis not present

## 2022-10-07 DIAGNOSIS — S52502D Unspecified fracture of the lower end of left radius, subsequent encounter for closed fracture with routine healing: Secondary | ICD-10-CM | POA: Diagnosis not present

## 2022-10-12 DIAGNOSIS — M25532 Pain in left wrist: Secondary | ICD-10-CM | POA: Diagnosis not present

## 2022-10-12 DIAGNOSIS — R42 Dizziness and giddiness: Secondary | ICD-10-CM | POA: Diagnosis not present

## 2022-10-12 DIAGNOSIS — M25642 Stiffness of left hand, not elsewhere classified: Secondary | ICD-10-CM | POA: Diagnosis not present

## 2022-10-12 DIAGNOSIS — S52502D Unspecified fracture of the lower end of left radius, subsequent encounter for closed fracture with routine healing: Secondary | ICD-10-CM | POA: Diagnosis not present

## 2022-10-14 DIAGNOSIS — M25642 Stiffness of left hand, not elsewhere classified: Secondary | ICD-10-CM | POA: Diagnosis not present

## 2022-10-14 DIAGNOSIS — M25532 Pain in left wrist: Secondary | ICD-10-CM | POA: Diagnosis not present

## 2022-10-14 DIAGNOSIS — R42 Dizziness and giddiness: Secondary | ICD-10-CM | POA: Diagnosis not present

## 2022-10-14 DIAGNOSIS — S52502D Unspecified fracture of the lower end of left radius, subsequent encounter for closed fracture with routine healing: Secondary | ICD-10-CM | POA: Diagnosis not present

## 2022-10-19 DIAGNOSIS — S52502D Unspecified fracture of the lower end of left radius, subsequent encounter for closed fracture with routine healing: Secondary | ICD-10-CM | POA: Diagnosis not present

## 2022-10-19 DIAGNOSIS — M25642 Stiffness of left hand, not elsewhere classified: Secondary | ICD-10-CM | POA: Diagnosis not present

## 2022-10-19 DIAGNOSIS — M25532 Pain in left wrist: Secondary | ICD-10-CM | POA: Diagnosis not present

## 2022-10-19 DIAGNOSIS — R42 Dizziness and giddiness: Secondary | ICD-10-CM | POA: Diagnosis not present

## 2022-10-21 DIAGNOSIS — M25642 Stiffness of left hand, not elsewhere classified: Secondary | ICD-10-CM | POA: Diagnosis not present

## 2022-10-21 DIAGNOSIS — S52502D Unspecified fracture of the lower end of left radius, subsequent encounter for closed fracture with routine healing: Secondary | ICD-10-CM | POA: Diagnosis not present

## 2022-10-21 DIAGNOSIS — M25532 Pain in left wrist: Secondary | ICD-10-CM | POA: Diagnosis not present

## 2022-10-21 DIAGNOSIS — R42 Dizziness and giddiness: Secondary | ICD-10-CM | POA: Diagnosis not present

## 2022-11-02 DIAGNOSIS — R42 Dizziness and giddiness: Secondary | ICD-10-CM | POA: Diagnosis not present

## 2022-11-02 DIAGNOSIS — S52502D Unspecified fracture of the lower end of left radius, subsequent encounter for closed fracture with routine healing: Secondary | ICD-10-CM | POA: Diagnosis not present

## 2022-11-02 DIAGNOSIS — M25642 Stiffness of left hand, not elsewhere classified: Secondary | ICD-10-CM | POA: Diagnosis not present

## 2022-11-02 DIAGNOSIS — M25532 Pain in left wrist: Secondary | ICD-10-CM | POA: Diagnosis not present

## 2022-11-04 DIAGNOSIS — J029 Acute pharyngitis, unspecified: Secondary | ICD-10-CM | POA: Diagnosis not present

## 2022-11-04 DIAGNOSIS — R059 Cough, unspecified: Secondary | ICD-10-CM | POA: Diagnosis not present

## 2022-11-04 DIAGNOSIS — U071 COVID-19: Secondary | ICD-10-CM | POA: Diagnosis not present

## 2022-11-04 DIAGNOSIS — R509 Fever, unspecified: Secondary | ICD-10-CM | POA: Diagnosis not present

## 2022-11-16 DIAGNOSIS — M25532 Pain in left wrist: Secondary | ICD-10-CM | POA: Diagnosis not present

## 2022-11-16 DIAGNOSIS — S52502D Unspecified fracture of the lower end of left radius, subsequent encounter for closed fracture with routine healing: Secondary | ICD-10-CM | POA: Diagnosis not present

## 2022-11-16 DIAGNOSIS — M25642 Stiffness of left hand, not elsewhere classified: Secondary | ICD-10-CM | POA: Diagnosis not present

## 2022-11-16 DIAGNOSIS — R42 Dizziness and giddiness: Secondary | ICD-10-CM | POA: Diagnosis not present

## 2023-01-18 DIAGNOSIS — J069 Acute upper respiratory infection, unspecified: Secondary | ICD-10-CM | POA: Diagnosis not present

## 2023-01-18 DIAGNOSIS — Z6841 Body Mass Index (BMI) 40.0 and over, adult: Secondary | ICD-10-CM | POA: Diagnosis not present

## 2023-04-01 DIAGNOSIS — Z20822 Contact with and (suspected) exposure to covid-19: Secondary | ICD-10-CM | POA: Diagnosis not present

## 2023-04-01 DIAGNOSIS — R059 Cough, unspecified: Secondary | ICD-10-CM | POA: Diagnosis not present

## 2023-04-01 DIAGNOSIS — R079 Chest pain, unspecified: Secondary | ICD-10-CM | POA: Diagnosis not present

## 2023-04-01 DIAGNOSIS — R5383 Other fatigue: Secondary | ICD-10-CM | POA: Diagnosis not present

## 2023-04-01 DIAGNOSIS — R519 Headache, unspecified: Secondary | ICD-10-CM | POA: Diagnosis not present

## 2023-04-01 DIAGNOSIS — R600 Localized edema: Secondary | ICD-10-CM | POA: Diagnosis not present

## 2023-04-01 DIAGNOSIS — R0989 Other specified symptoms and signs involving the circulatory and respiratory systems: Secondary | ICD-10-CM | POA: Diagnosis not present

## 2023-04-01 DIAGNOSIS — R42 Dizziness and giddiness: Secondary | ICD-10-CM | POA: Diagnosis not present

## 2023-04-01 DIAGNOSIS — M79604 Pain in right leg: Secondary | ICD-10-CM | POA: Diagnosis not present

## 2023-04-01 DIAGNOSIS — I6782 Cerebral ischemia: Secondary | ICD-10-CM | POA: Diagnosis not present

## 2023-04-01 DIAGNOSIS — J9811 Atelectasis: Secondary | ICD-10-CM | POA: Diagnosis not present

## 2023-04-01 DIAGNOSIS — R0789 Other chest pain: Secondary | ICD-10-CM | POA: Diagnosis not present

## 2023-04-02 DIAGNOSIS — R600 Localized edema: Secondary | ICD-10-CM | POA: Diagnosis not present

## 2023-04-02 DIAGNOSIS — R519 Headache, unspecified: Secondary | ICD-10-CM | POA: Diagnosis not present

## 2023-04-02 DIAGNOSIS — I498 Other specified cardiac arrhythmias: Secondary | ICD-10-CM | POA: Diagnosis not present

## 2023-04-02 DIAGNOSIS — I6782 Cerebral ischemia: Secondary | ICD-10-CM | POA: Diagnosis not present

## 2023-04-02 DIAGNOSIS — M79604 Pain in right leg: Secondary | ICD-10-CM | POA: Diagnosis not present

## 2023-04-18 DIAGNOSIS — N644 Mastodynia: Secondary | ICD-10-CM | POA: Diagnosis not present

## 2023-04-19 DIAGNOSIS — Z6838 Body mass index (BMI) 38.0-38.9, adult: Secondary | ICD-10-CM | POA: Diagnosis not present

## 2023-04-19 DIAGNOSIS — I10 Essential (primary) hypertension: Secondary | ICD-10-CM | POA: Diagnosis not present

## 2023-04-19 DIAGNOSIS — E7800 Pure hypercholesterolemia, unspecified: Secondary | ICD-10-CM | POA: Diagnosis not present

## 2023-04-19 DIAGNOSIS — F439 Reaction to severe stress, unspecified: Secondary | ICD-10-CM | POA: Diagnosis not present

## 2023-04-19 DIAGNOSIS — E559 Vitamin D deficiency, unspecified: Secondary | ICD-10-CM | POA: Diagnosis not present

## 2023-04-19 DIAGNOSIS — Z0000 Encounter for general adult medical examination without abnormal findings: Secondary | ICD-10-CM | POA: Diagnosis not present

## 2023-04-19 DIAGNOSIS — M8580 Other specified disorders of bone density and structure, unspecified site: Secondary | ICD-10-CM | POA: Diagnosis not present

## 2023-05-03 DIAGNOSIS — R112 Nausea with vomiting, unspecified: Secondary | ICD-10-CM | POA: Diagnosis not present

## 2023-05-03 DIAGNOSIS — R197 Diarrhea, unspecified: Secondary | ICD-10-CM | POA: Diagnosis not present

## 2023-05-14 DIAGNOSIS — R9389 Abnormal findings on diagnostic imaging of other specified body structures: Secondary | ICD-10-CM | POA: Diagnosis not present

## 2023-05-14 DIAGNOSIS — N950 Postmenopausal bleeding: Secondary | ICD-10-CM | POA: Diagnosis not present

## 2023-05-14 DIAGNOSIS — N939 Abnormal uterine and vaginal bleeding, unspecified: Secondary | ICD-10-CM | POA: Diagnosis not present

## 2023-05-17 DIAGNOSIS — N950 Postmenopausal bleeding: Secondary | ICD-10-CM | POA: Diagnosis not present

## 2023-05-17 DIAGNOSIS — N840 Polyp of corpus uteri: Secondary | ICD-10-CM | POA: Diagnosis not present

## 2023-05-17 DIAGNOSIS — N710 Acute inflammatory disease of uterus: Secondary | ICD-10-CM | POA: Diagnosis not present

## 2023-07-05 DIAGNOSIS — N840 Polyp of corpus uteri: Secondary | ICD-10-CM | POA: Diagnosis not present

## 2023-07-05 DIAGNOSIS — N939 Abnormal uterine and vaginal bleeding, unspecified: Secondary | ICD-10-CM | POA: Diagnosis not present

## 2023-07-06 DIAGNOSIS — Z91013 Allergy to seafood: Secondary | ICD-10-CM | POA: Diagnosis not present

## 2023-07-06 DIAGNOSIS — N939 Abnormal uterine and vaginal bleeding, unspecified: Secondary | ICD-10-CM | POA: Diagnosis not present

## 2023-07-06 DIAGNOSIS — R9389 Abnormal findings on diagnostic imaging of other specified body structures: Secondary | ICD-10-CM | POA: Diagnosis not present

## 2023-07-06 DIAGNOSIS — Z882 Allergy status to sulfonamides status: Secondary | ICD-10-CM | POA: Diagnosis not present

## 2023-07-06 DIAGNOSIS — Z79899 Other long term (current) drug therapy: Secondary | ICD-10-CM | POA: Diagnosis not present

## 2023-07-06 DIAGNOSIS — N858 Other specified noninflammatory disorders of uterus: Secondary | ICD-10-CM | POA: Diagnosis not present

## 2023-07-06 DIAGNOSIS — N841 Polyp of cervix uteri: Secondary | ICD-10-CM | POA: Diagnosis not present

## 2023-07-06 DIAGNOSIS — I10 Essential (primary) hypertension: Secondary | ICD-10-CM | POA: Diagnosis not present

## 2023-07-06 DIAGNOSIS — N840 Polyp of corpus uteri: Secondary | ICD-10-CM | POA: Diagnosis not present

## 2023-07-06 DIAGNOSIS — Z885 Allergy status to narcotic agent status: Secondary | ICD-10-CM | POA: Diagnosis not present

## 2023-07-06 DIAGNOSIS — N950 Postmenopausal bleeding: Secondary | ICD-10-CM | POA: Diagnosis not present

## 2023-08-19 DIAGNOSIS — Z09 Encounter for follow-up examination after completed treatment for conditions other than malignant neoplasm: Secondary | ICD-10-CM | POA: Diagnosis not present

## 2023-09-21 DIAGNOSIS — E7800 Pure hypercholesterolemia, unspecified: Secondary | ICD-10-CM | POA: Diagnosis not present

## 2023-09-21 DIAGNOSIS — H04203 Unspecified epiphora, bilateral lacrimal glands: Secondary | ICD-10-CM | POA: Diagnosis not present

## 2023-09-21 DIAGNOSIS — F439 Reaction to severe stress, unspecified: Secondary | ICD-10-CM | POA: Diagnosis not present

## 2023-10-20 DIAGNOSIS — M8588 Other specified disorders of bone density and structure, other site: Secondary | ICD-10-CM | POA: Diagnosis not present

## 2023-10-20 DIAGNOSIS — E559 Vitamin D deficiency, unspecified: Secondary | ICD-10-CM | POA: Diagnosis not present

## 2023-10-20 DIAGNOSIS — R5383 Other fatigue: Secondary | ICD-10-CM | POA: Diagnosis not present

## 2023-10-20 DIAGNOSIS — M8580 Other specified disorders of bone density and structure, unspecified site: Secondary | ICD-10-CM | POA: Diagnosis not present

## 2023-10-20 DIAGNOSIS — R0681 Apnea, not elsewhere classified: Secondary | ICD-10-CM | POA: Diagnosis not present

## 2023-10-20 DIAGNOSIS — E7800 Pure hypercholesterolemia, unspecified: Secondary | ICD-10-CM | POA: Diagnosis not present

## 2023-10-20 DIAGNOSIS — R0683 Snoring: Secondary | ICD-10-CM | POA: Diagnosis not present

## 2023-10-20 DIAGNOSIS — F439 Reaction to severe stress, unspecified: Secondary | ICD-10-CM | POA: Diagnosis not present

## 2023-10-20 DIAGNOSIS — I10 Essential (primary) hypertension: Secondary | ICD-10-CM | POA: Diagnosis not present

## 2024-01-29 DIAGNOSIS — G4761 Periodic limb movement disorder: Secondary | ICD-10-CM | POA: Diagnosis not present

## 2024-02-03 DIAGNOSIS — G4761 Periodic limb movement disorder: Secondary | ICD-10-CM | POA: Diagnosis not present
